# Patient Record
Sex: Female | Born: 1975 | Race: White | Hispanic: No | Marital: Married | Smoking: Never smoker
Health system: Southern US, Community
[De-identification: ages and names within clinical notes are randomized; demographics above are authoritative.]

## PROBLEM LIST (undated history)

## (undated) DIAGNOSIS — R519 Headache, unspecified: Secondary | ICD-10-CM

## (undated) DIAGNOSIS — F419 Anxiety disorder, unspecified: Secondary | ICD-10-CM

## (undated) DIAGNOSIS — R51 Headache: Secondary | ICD-10-CM

## (undated) DIAGNOSIS — J40 Bronchitis, not specified as acute or chronic: Secondary | ICD-10-CM

## (undated) DIAGNOSIS — M549 Dorsalgia, unspecified: Secondary | ICD-10-CM

## (undated) DIAGNOSIS — K449 Diaphragmatic hernia without obstruction or gangrene: Secondary | ICD-10-CM

## (undated) HISTORY — DX: Anxiety disorder, unspecified: F41.9

## (undated) HISTORY — DX: Dorsalgia, unspecified: M54.9

## (undated) HISTORY — DX: Headache: R51

## (undated) HISTORY — DX: Bronchitis, not specified as acute or chronic: J40

## (undated) HISTORY — DX: Diaphragmatic hernia without obstruction or gangrene: K44.9

## (undated) HISTORY — DX: Headache, unspecified: R51.9

## (undated) HISTORY — PX: WISDOM TOOTH EXTRACTION: SHX21

---

## 2006-08-02 ENCOUNTER — Ambulatory Visit (HOSPITAL_COMMUNITY): Admission: RE | Admit: 2006-08-02 | Discharge: 2006-08-02 | Payer: Self-pay | Admitting: Family Medicine

## 2007-04-22 ENCOUNTER — Emergency Department (HOSPITAL_COMMUNITY): Admission: EM | Admit: 2007-04-22 | Discharge: 2007-04-22 | Payer: Self-pay | Admitting: Emergency Medicine

## 2010-09-24 ENCOUNTER — Ambulatory Visit (HOSPITAL_COMMUNITY)
Admission: RE | Admit: 2010-09-24 | Discharge: 2010-09-24 | Disposition: A | Discharge status: Home / Self Care | Payer: BC Managed Care – PPO | Source: Ambulatory Visit | Attending: Internal Medicine | Admitting: Internal Medicine

## 2010-09-24 ENCOUNTER — Other Ambulatory Visit (HOSPITAL_COMMUNITY): Payer: Self-pay | Admitting: Internal Medicine

## 2010-09-24 DIAGNOSIS — R05 Cough: Secondary | ICD-10-CM

## 2010-09-24 DIAGNOSIS — J029 Acute pharyngitis, unspecified: Secondary | ICD-10-CM | POA: Insufficient documentation

## 2010-09-24 DIAGNOSIS — R059 Cough, unspecified: Secondary | ICD-10-CM | POA: Insufficient documentation

## 2010-10-26 LAB — RAPID STREP SCREEN (MED CTR MEBANE ONLY): Streptococcus, Group A Screen (Direct): NEGATIVE

## 2010-10-26 LAB — URINALYSIS, ROUTINE W REFLEX MICROSCOPIC
Nitrite: NEGATIVE
Specific Gravity, Urine: 1.022
Urobilinogen, UA: 0.2

## 2010-10-26 LAB — DIFFERENTIAL
Basophils Absolute: 0
Lymphocytes Relative: 18
Monocytes Absolute: 0.6
Neutro Abs: 5.2
Neutrophils Relative %: 71

## 2010-10-26 LAB — COMPREHENSIVE METABOLIC PANEL
Albumin: 3.8
BUN: 12
Creatinine, Ser: 0.78
Glucose, Bld: 100 — ABNORMAL HIGH
Total Bilirubin: 0.6
Total Protein: 6.9

## 2010-10-26 LAB — STREP A DNA PROBE

## 2010-10-26 LAB — CBC
HCT: 37.2
MCV: 83.1
Platelets: 333
RDW: 12.6

## 2010-10-26 LAB — PREGNANCY, URINE: Preg Test, Ur: NEGATIVE

## 2010-10-26 LAB — URINE MICROSCOPIC-ADD ON

## 2010-12-09 ENCOUNTER — Ambulatory Visit (INDEPENDENT_AMBULATORY_CARE_PROVIDER_SITE_OTHER): Payer: BC Managed Care – PPO | Admitting: General Surgery

## 2010-12-09 ENCOUNTER — Encounter (INDEPENDENT_AMBULATORY_CARE_PROVIDER_SITE_OTHER): Payer: Self-pay | Admitting: General Surgery

## 2010-12-09 ENCOUNTER — Other Ambulatory Visit (INDEPENDENT_AMBULATORY_CARE_PROVIDER_SITE_OTHER): Payer: Self-pay | Admitting: General Surgery

## 2010-12-09 VITALS — BP 122/80 | HR 64 | Temp 97.4°F | Resp 14 | Ht 64.0 in | Wt 218.0 lb

## 2010-12-09 DIAGNOSIS — K8010 Calculus of gallbladder with chronic cholecystitis without obstruction: Secondary | ICD-10-CM

## 2010-12-09 NOTE — Progress Notes (Signed)
Subjective:     Patient ID: Judy Olson, female   DOB: 04/06/1975, 35 y.o.   MRN: [id number]  HPI   Review of Systems Patient is a 35-year-old mother who is referred [facility] the [facility] for symptomatic gallstones she said her first episode of pain wasn't really when she was pregnant with her second child approximately 3 years ago over the last 3 years she's had intermittent episodes lately once every other month or so and the pain would usually last about an hour she's had some more severe episodes and then more frequent in September and that's what prompted the ultrasound that was performed at an [facility] and that showed multipal stones and a 6 mm bileduct. 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 little school as possible. She has made arrangements for her mother-in-law acute the 2 children ages 3 and 7 husband come with her with the surgery is done as an outpatient and she might spend a night after surgery.   No Known Allergies History reviewed. No pertinent past surgical history. Current Outpatient Prescriptions  Medication Sig Dispense Refill  . ibuprofen (ADVIL,MOTRIN) 800 MG tablet Ad lib.      . loratadine (CLARITIN) 10 MG tablet Take 10 mg by mouth daily.        . MICROGESTIN FE 1/20 1-20 MG-MCG tablet       . citalopram (CELEXA) 40 MG tablet 40 mg daily.       . desvenlafaxine (PRISTIQ) 100 MG 24 hr tablet Take 100 mg by mouth daily. Please check dose       . omeprazole (PRILOSEC) 10 MG capsule Take 10 mg by mouth as needed. 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 she had 2 vaginal deliveries her her only surgical procedure. Objective:   Physical ExamBP 122/80   Pulse 64  Temp(Src) 97.4 F (36.3 C) (Temporal)  Resp 14  Ht 5' 4" (1.626 m)  Wt 218 lb (98.884 kg)  BMI 37.42 kg/m2 Patient is a slightly overweight Caucasian female in no acute distress eyes ears nose and throat unremarkable lungs are clear cardiac normal sinus rhythm and did not actual breast exam she's got no tenderness in the upper right abdomen no evidence of an umbilical hernia in the groin hernias I did not do a pelvic rectal examination. Extremities unremarkable no pedal edema CNS and physiologic    Assessment:    Symptomatic gallstones I reviewed the little booklet about biliary surgery with her and I do not have a copy of the cement she's not allergic to penicillin and will arrange for her surgery the week before Thanksgiving recess so that hopefully she'll miss. 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     Plan:     See above      

## 2010-12-09 NOTE — Patient Instructions (Signed)
Low-fat small sized meals and do not eating better to 3 hours after a meal until your surgery. We will let him make a decision in the recover room on spenting one night or home the day of surgery

## 2010-12-09 NOTE — H&P (Signed)
Patient ID: Judy Olson, female   DOB: January 02, 1976, 35 y.o.   MRN: [id number]  HPI   Review of Systems Patient is a 35 year old mother who is referred [facility] the [facility] for symptomatic gallstones she said her first episode of pain wasn't really when she was pregnant with her second child approximately 3 years ago over the last 3 years she's had intermittent episodes lately once every other month or so and the pain would usually last about an hour she's had some more severe episodes and then more frequent in September and that's what prompted the ultrasound that was performed at an [facility] and that showed multipal stones and a 6 mm bileduct. 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 little school as possible. She has made arrangements for her mother-in-law acute the 2 children ages 69 and 7 husband come with her with the surgery is done as an outpatient and she might spend a night after surgery.   No Known Allergies History reviewed. No pertinent past surgical history. Current Outpatient Prescriptions  Medication Sig Dispense Refill  . ibuprofen (ADVIL,MOTRIN) 800 MG tablet Ad lib.      Marland Kitchen loratadine (CLARITIN) 10 MG tablet Take 10 mg by mouth daily.        Marland Kitchen MICROGESTIN FE 1/20 1-20 MG-MCG tablet       . citalopram (CELEXA) 40 MG tablet 40 mg daily.       Marland Kitchen desvenlafaxine (PRISTIQ) 100 MG 24 hr tablet Take 100 mg by mouth daily. Please check dose       . omeprazole (PRILOSEC) 10 MG capsule Take 10 mg by mouth as needed. 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 she had 2 vaginal deliveries her her only surgical procedure. Objective:   Physical ExamBP 122/80  Pulse 64   Temp(Src) 97.4 F (36.3 C) (Temporal)  Resp 14  Ht 5\' 4"  (1.626 m)  Wt 218 lb (98.884 kg)  BMI 37.42 kg/m2 Patient is a slightly overweight Caucasian female in no acute distress eyes ears nose and throat unremarkable lungs are clear cardiac normal sinus rhythm and did not actual breast exam she's got no tenderness in the upper right abdomen no evidence of an umbilical hernia in the groin hernias I did not do a pelvic rectal examination. Extremities unremarkable no pedal edema CNS and physiologic    Assessment:    Symptomatic gallstones I reviewed the little booklet about biliary surgery with her and I do not have a copy of the cement she's not allergic to penicillin and will arrange for her surgery the week before Thanksgiving recess so that hopefully she'll miss. 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

## 2010-12-10 ENCOUNTER — Encounter (HOSPITAL_COMMUNITY): Payer: Self-pay | Admitting: Pharmacy Technician

## 2010-12-16 ENCOUNTER — Encounter (HOSPITAL_COMMUNITY): Payer: BC Managed Care – PPO

## 2010-12-16 ENCOUNTER — Encounter (HOSPITAL_COMMUNITY): Payer: Self-pay

## 2010-12-16 LAB — COMPREHENSIVE METABOLIC PANEL
ALT: 8 U/L (ref 0–35)
Alkaline Phosphatase: 70 U/L (ref 39–117)
BUN: 16 mg/dL (ref 6–23)
CO2: 27 mEq/L (ref 19–32)
Chloride: 101 mEq/L (ref 96–112)
Glucose, Bld: 105 mg/dL — ABNORMAL HIGH (ref 70–99)
Potassium: 3.6 mEq/L (ref 3.5–5.1)
Sodium: 136 mEq/L (ref 135–145)
Total Bilirubin: 0.1 mg/dL — ABNORMAL LOW (ref 0.3–1.2)
Total Protein: 7.1 g/dL (ref 6.0–8.3)

## 2010-12-16 LAB — DIFFERENTIAL
Lymphocytes Relative: 22 % (ref 12–46)
Lymphs Abs: 1.8 10*3/uL (ref 0.7–4.0)
Monocytes Absolute: 0.5 10*3/uL (ref 0.1–1.0)
Monocytes Relative: 6 % (ref 3–12)
Neutro Abs: 5.6 10*3/uL (ref 1.7–7.7)

## 2010-12-16 LAB — HCG, SERUM, QUALITATIVE: Preg, Serum: NEGATIVE

## 2010-12-16 LAB — CBC
HCT: 37.3 % (ref 36.0–46.0)
Hemoglobin: 12.8 g/dL (ref 12.0–15.0)
MCHC: 34.3 g/dL (ref 30.0–36.0)
RBC: 4.42 MIL/uL (ref 3.87–5.11)

## 2010-12-16 NOTE — Pre-Procedure Instructions (Signed)
Chest x ray from 8/12 in epic

## 2010-12-16 NOTE — Patient Instructions (Signed)
20 Salle Brandle  12/16/2010   Your procedure is scheduled on:  12/23/2010   0930-1100  Report to [facility] at   0730 AM.  Call this number if you have problems the morning of surgery: [phone]   Remember:   Do not eat food:After Midnight.  Tuesday  NIGHT  Do not drink clear liquids: After Midnight.TUESDAY NIGHT  Take these medicines the morning of surgery with A SIP OF WATER: omeprazole, celexa and claritin with sip water   Do not wear jewelry, make-up or nail polish.  Do not wear lotions, powders, or perfumes. You may wear deodorant.  Do not shave 48 hours prior to surgery.  Do not bring valuables to the hospital.  Contacts, dentures or bridgework may not be worn into surgery.  Leave suitcase in the car. After surgery it may be brought to your room.  For patients admitted to the hospital, checkout time is 11:00 AM the day of discharge.   Patients discharged the day of surgery will not be allowed to drive home.  Name and phone number of your driver: husband  Special Instructions: CHG Shower Use Special Wash: 1/2 bottle night before surgery and 1/2 bottle morning of surgery.  No shaving after Saturday, regular soap face and privates   Please read over the following fact sheets that you were given: MRSA Information

## 2010-12-18 ENCOUNTER — Telehealth (INDEPENDENT_AMBULATORY_CARE_PROVIDER_SITE_OTHER): Payer: Self-pay | Admitting: General Surgery

## 2010-12-18 NOTE — Telephone Encounter (Signed)
Call returned.

## 2010-12-23 ENCOUNTER — Encounter (HOSPITAL_COMMUNITY): Payer: Self-pay | Admitting: *Deleted

## 2010-12-23 ENCOUNTER — Encounter (HOSPITAL_COMMUNITY)
Admission: RE | Disposition: A | Discharge status: Home / Self Care | Payer: Self-pay | Source: Ambulatory Visit | Attending: General Surgery

## 2010-12-23 ENCOUNTER — Ambulatory Visit (HOSPITAL_COMMUNITY): Payer: BC Managed Care – PPO

## 2010-12-23 ENCOUNTER — Ambulatory Visit (HOSPITAL_COMMUNITY): Payer: BC Managed Care – PPO | Admitting: *Deleted

## 2010-12-23 ENCOUNTER — Encounter (INDEPENDENT_AMBULATORY_CARE_PROVIDER_SITE_OTHER): Payer: Self-pay

## 2010-12-23 ENCOUNTER — Ambulatory Visit (HOSPITAL_COMMUNITY)
Admission: RE | Admit: 2010-12-23 | Discharge: 2010-12-24 | Disposition: A | Discharge status: Home / Self Care | Payer: BC Managed Care – PPO | Source: Ambulatory Visit | Attending: General Surgery | Admitting: General Surgery

## 2010-12-23 ENCOUNTER — Other Ambulatory Visit (INDEPENDENT_AMBULATORY_CARE_PROVIDER_SITE_OTHER): Payer: Self-pay | Admitting: General Surgery

## 2010-12-23 ENCOUNTER — Encounter (INDEPENDENT_AMBULATORY_CARE_PROVIDER_SITE_OTHER): Payer: Self-pay | Admitting: Nurse Practitioner

## 2010-12-23 DIAGNOSIS — K8010 Calculus of gallbladder with chronic cholecystitis without obstruction: Secondary | ICD-10-CM

## 2010-12-23 DIAGNOSIS — Z01812 Encounter for preprocedural laboratory examination: Secondary | ICD-10-CM | POA: Insufficient documentation

## 2010-12-23 DIAGNOSIS — R109 Unspecified abdominal pain: Secondary | ICD-10-CM | POA: Insufficient documentation

## 2010-12-23 DIAGNOSIS — Z79899 Other long term (current) drug therapy: Secondary | ICD-10-CM | POA: Insufficient documentation

## 2010-12-23 HISTORY — PX: CHOLECYSTECTOMY: SHX55

## 2010-12-23 SURGERY — LAPAROSCOPIC CHOLECYSTECTOMY WITH INTRAOPERATIVE CHOLANGIOGRAM
Anesthesia: General | Site: Abdomen | Wound class: Clean Contaminated

## 2010-12-23 MED ORDER — NEOSTIGMINE METHYLSULFATE 1 MG/ML IJ SOLN
INTRAMUSCULAR | Status: DC | PRN
  Administered 2010-12-23: 5 mg via INTRAVENOUS

## 2010-12-23 MED ORDER — ACETAMINOPHEN 10 MG/ML IV SOLN
INTRAVENOUS | Status: DC | PRN
  Administered 2010-12-23: 1000 mg via INTRAVENOUS

## 2010-12-23 MED ORDER — IOHEXOL 300 MG/ML  SOLN
INTRAMUSCULAR | Status: DC | PRN
  Administered 2010-12-23: 10:00:00 via INTRAMUSCULAR

## 2010-12-23 MED ORDER — LIDOCAINE HCL (CARDIAC) 20 MG/ML IV SOLN
INTRAVENOUS | Status: DC | PRN
  Administered 2010-12-23: 100 mg via INTRAVENOUS

## 2010-12-23 MED ORDER — NORETHIN ACE-ETH ESTRAD-FE 1-20 MG-MCG PO TABS: 1.0000 | ORAL_TABLET | Freq: Every day | ORAL | Status: DC

## 2010-12-23 MED ORDER — FENTANYL CITRATE 0.05 MG/ML IJ SOLN: 25.0000 ug | INTRAMUSCULAR | Status: DC | PRN

## 2010-12-23 MED ORDER — GLYCOPYRROLATE 0.2 MG/ML IJ SOLN
INTRAMUSCULAR | Status: DC | PRN
  Administered 2010-12-23: .8 mg via INTRAVENOUS

## 2010-12-23 MED ORDER — LACTATED RINGERS IV SOLN
INTRAVENOUS | Status: DC | PRN
  Administered 2010-12-23: 09:00:00 via INTRAVENOUS

## 2010-12-23 MED ORDER — BUPIVACAINE-EPINEPHRINE 0.25% -1:200000 IJ SOLN
INTRAMUSCULAR | Status: DC | PRN
  Administered 2010-12-23: 30 mL

## 2010-12-23 MED ORDER — PROPOFOL 10 MG/ML IV EMUL
INTRAVENOUS | Status: DC | PRN
  Administered 2010-12-23: 150 mg via INTRAVENOUS

## 2010-12-23 MED ORDER — ROCURONIUM BROMIDE 100 MG/10ML IV SOLN
INTRAVENOUS | Status: DC | PRN
  Administered 2010-12-23: 50 mg via INTRAVENOUS
  Administered 2010-12-23: 10 mg via INTRAVENOUS

## 2010-12-23 MED ORDER — DEXAMETHASONE SODIUM PHOSPHATE 10 MG/ML IJ SOLN
INTRAMUSCULAR | Status: DC | PRN
  Administered 2010-12-23: 10 mg via INTRAVENOUS

## 2010-12-23 MED ORDER — ONDANSETRON HCL 4 MG/2ML IJ SOLN
INTRAMUSCULAR | Status: DC | PRN
  Administered 2010-12-23: 4 mg via INTRAVENOUS

## 2010-12-23 MED ORDER — KCL IN DEXTROSE-NACL 20-5-0.45 MEQ/L-%-% IV SOLN
INTRAVENOUS | Status: DC
  Administered 2010-12-23: 18:00:00 via INTRAVENOUS
  Filled 2010-12-23 (×5): qty 1000

## 2010-12-23 MED ORDER — FENTANYL CITRATE 0.05 MG/ML IJ SOLN
INTRAMUSCULAR | Status: DC | PRN
  Administered 2010-12-23 (×2): 100 ug via INTRAVENOUS
  Administered 2010-12-23: 50 ug via INTRAVENOUS

## 2010-12-23 MED ORDER — HYDROCODONE-ACETAMINOPHEN 5-300 MG PO TABS: 1.0000 | ORAL_TABLET | ORAL | Status: DC | PRN

## 2010-12-23 MED ORDER — DESVENLAFAXINE SUCCINATE ER 100 MG PO TB24
100.0000 mg | ORAL_TABLET | ORAL | Status: DC
  Filled 2010-12-23 (×3): qty 1

## 2010-12-23 MED ORDER — HYDROMORPHONE HCL PF 1 MG/ML IJ SOLN
INTRAMUSCULAR | Status: DC | PRN
  Administered 2010-12-23 (×2): 1 mg via INTRAVENOUS

## 2010-12-23 MED ORDER — LACTATED RINGERS IR SOLN
Status: DC | PRN
  Administered 2010-12-23: 1000 mL

## 2010-12-23 MED ORDER — LORATADINE 10 MG PO TABS
10.0000 mg | ORAL_TABLET | Freq: Every day | ORAL | Status: DC
  Filled 2010-12-23 (×2): qty 1

## 2010-12-23 MED ORDER — NAPHAZOLINE HCL 0.1 % OP SOLN
1.0000 [drp] | Freq: Every day | OPHTHALMIC | Status: DC
  Filled 2010-12-23: qty 15

## 2010-12-23 MED ORDER — LACTATED RINGERS IV SOLN: INTRAVENOUS | Status: DC

## 2010-12-23 MED ORDER — CITALOPRAM HYDROBROMIDE 40 MG PO TABS
40.0000 mg | ORAL_TABLET | ORAL | Status: DC
  Administered 2010-12-24: 40 mg via ORAL
  Filled 2010-12-23 (×2): qty 1

## 2010-12-23 MED ORDER — HYDROCODONE-ACETAMINOPHEN 5-325 MG PO TABS: 1.0000 | ORAL_TABLET | ORAL | Status: DC | PRN

## 2010-12-23 MED ORDER — MIDAZOLAM HCL 5 MG/5ML IJ SOLN
INTRAMUSCULAR | Status: DC | PRN
  Administered 2010-12-23: 2 mg via INTRAVENOUS

## 2010-12-23 MED ORDER — SODIUM CHLORIDE 0.9 % IV SOLN
3.0000 g | INTRAVENOUS | Status: AC
  Administered 2010-12-23: 3 g via INTRAVENOUS

## 2010-12-23 MED ORDER — MORPHINE SULFATE 2 MG/ML IJ SOLN
2.0000 mg | INTRAMUSCULAR | Status: DC | PRN
  Administered 2010-12-23 (×3): 2 mg via INTRAVENOUS
  Filled 2010-12-23 (×2): qty 1

## 2010-12-23 MED ORDER — IBUPROFEN 800 MG PO TABS
800.0000 mg | ORAL_TABLET | Freq: Three times a day (TID) | ORAL | Status: DC | PRN
  Administered 2010-12-23: 800 mg via ORAL
  Filled 2010-12-23: qty 1

## 2010-12-23 MED ORDER — NAPHAZOLINE-GLYCERIN 0.012-0.2 % OP SOLN
1.0000 [drp] | OPHTHALMIC | Status: DC
  Filled 2010-12-23: qty 15

## 2010-12-23 MED ORDER — SODIUM CHLORIDE 0.9 % IR SOLN
Status: DC | PRN
  Administered 2010-12-23: 1000 mL

## 2010-12-23 MED ORDER — ONDANSETRON HCL 4 MG/5ML PO SOLN
4.0000 mg | Freq: Three times a day (TID) | ORAL | Status: DC | PRN
  Filled 2010-12-23: qty 5

## 2010-12-23 MED ORDER — PROMETHAZINE HCL 25 MG/ML IJ SOLN
6.2500 mg | INTRAMUSCULAR | Status: DC | PRN
  Administered 2010-12-23: 6.25 mg via INTRAVENOUS

## 2010-12-23 SURGICAL SUPPLY — 34 items
APPLIER CLIP ROT 10 11.4 M/L (STAPLE) ×2
APR CLP MED LRG 11.4X10 (STAPLE) ×1
BAG SPEC RTRVL LRG 6X4 10 (ENDOMECHANICALS) ×1
BENZOIN TINCTURE PRP APPL 2/3 (GAUZE/BANDAGES/DRESSINGS) ×2 IMPLANT
CANISTER SUCTION 2500CC (MISCELLANEOUS) ×2 IMPLANT
CLIP APPLIE ROT 10 11.4 M/L (STAPLE) ×1 IMPLANT
CLOTH BEACON ORANGE TIMEOUT ST (SAFETY) ×2 IMPLANT
COVER MAYO STAND STRL (DRAPES) ×2 IMPLANT
DECANTER SPIKE VIAL GLASS SM (MISCELLANEOUS) IMPLANT
DRAPE C-ARM 42X72 X-RAY (DRAPES) ×2 IMPLANT
DRAPE LAPAROSCOPIC ABDOMINAL (DRAPES) ×2 IMPLANT
ELECT REM PT RETURN 9FT ADLT (ELECTROSURGICAL) ×2
ELECTRODE REM PT RTRN 9FT ADLT (ELECTROSURGICAL) ×1 IMPLANT
GLOVE BIOGEL PI IND STRL 7.0 (GLOVE) ×1 IMPLANT
GLOVE BIOGEL PI INDICATOR 7.0 (GLOVE) ×1
GLOVE ORTHO TXT STRL SZ7.5 (GLOVE) ×2 IMPLANT
GOWN PREVENTION PLUS XLARGE (GOWN DISPOSABLE) ×2 IMPLANT
GOWN STRL NON-REIN LRG LVL3 (GOWN DISPOSABLE) ×2 IMPLANT
GOWN STRL REIN XL XLG (GOWN DISPOSABLE) ×2 IMPLANT
HEMOSTAT SURGICEL 4X8 (HEMOSTASIS) IMPLANT
KIT BASIN OR (CUSTOM PROCEDURE TRAY) ×2 IMPLANT
NS IRRIG 1000ML POUR BTL (IV SOLUTION) IMPLANT
POUCH SPECIMEN RETRIEVAL 10MM (ENDOMECHANICALS) ×2 IMPLANT
SET CHOLANGIOGRAPH MIX (MISCELLANEOUS) ×2 IMPLANT
SET IRRIG TUBING LAPAROSCOPIC (IRRIGATION / IRRIGATOR) ×2 IMPLANT
SOLUTION ANTI FOG 6CC (MISCELLANEOUS) ×2 IMPLANT
STRIP CLOSURE SKIN 1/2X4 (GAUZE/BANDAGES/DRESSINGS) ×2 IMPLANT
SUT VIC AB 0 UR5 27 (SUTURE) IMPLANT
SUT VIC AB 4-0 PS2 27 (SUTURE) ×2 IMPLANT
TRAY LAP CHOLE (CUSTOM PROCEDURE TRAY) ×2 IMPLANT
TROCAR BLADELESS OPT 5 75 (ENDOMECHANICALS) ×4 IMPLANT
TROCAR XCEL BLUNT TIP 100MML (ENDOMECHANICALS) ×2 IMPLANT
TROCAR XCEL NON-BLD 11X100MML (ENDOMECHANICALS) ×2 IMPLANT
TUBING INSUFFLATION 10FT LAP (TUBING) ×2 IMPLANT

## 2010-12-23 NOTE — Transfer of Care (Signed)
Immediate Anesthesia Transfer of Care Note  Patient: Judy Olson  Procedure(s) Performed:  LAPAROSCOPIC CHOLECYSTECTOMY WITH INTRAOPERATIVE CHOLANGIOGRAM - LAP CHOLE WITH X-RAY  Patient Location: PACU  Anesthesia Type: General  Level of Consciousness: awake, alert  and oriented  Airway & Oxygen Therapy: Patient Spontanous Breathing and Patient connected to face mask oxygen  Post-op Assessment: Report given to PACU RN and Post -op Vital signs reviewed and stable  Post vital signs: Reviewed and stable  Complications: No apparent anesthesia complications

## 2010-12-23 NOTE — Interval H&P Note (Signed)
History and Physical Interval Note:   12/23/2010   9:05 AM   Judy Olson  has presented today for surgery, with the diagnosis of GALLSTONES  The various methods of treatment have been discussed with the patient and family. After consideration of risks, benefits and other options for treatment, the patient has consented to  Procedure(s): LAPAROSCOPIC CHOLECYSTECTOMY WITH INTRAOPERATIVE CHOLANGIOGRAM as a surgical intervention .  The patients' history has been reviewed, patient examined, no change in status, stable for surgery.  I have reviewed the patients' chart and labs.  Questions were answered to the patient's satisfaction.     Judy Coach  MD Pt reexamined no change permit signed. Pt plans discharge after or. chronic cholecystitis with stones

## 2010-12-23 NOTE — Anesthesia Preprocedure Evaluation (Signed)
Anesthesia Evaluation  Patient identified by MRN, date of birth, ID band Patient awake    Reviewed: Allergy & Precautions, H&P , NPO status , Patient's Chart, lab work & pertinent test results  Airway Mallampati: II TM Distance: >3 FB Neck ROM: Full    Dental No notable dental hx. (+) Teeth Intact   Pulmonary neg pulmonary ROS,  clear to auscultation  Pulmonary exam normal       Cardiovascular neg cardio ROS Regular Normal    Neuro/Psych  Headaches, PSYCHIATRIC DISORDERS Anxiety    GI/Hepatic negative GI ROS, Neg liver ROS,   Endo/Other  Morbid obesity  Renal/GU negative Renal ROS  Genitourinary negative   Musculoskeletal negative musculoskeletal ROS (+)   Abdominal   Peds negative pediatric ROS (+)  Hematology negative hematology ROS (+)   Anesthesia Other Findings   Reproductive/Obstetrics negative OB ROS                           Anesthesia Physical Anesthesia Plan  ASA: II  Anesthesia Plan: General   Post-op Pain Management:    Induction: Intravenous  Airway Management Planned: Oral ETT  Additional Equipment:   Intra-op Plan:   Post-operative Plan: Extubation in OR  Informed Consent: I have reviewed the patients History and Physical, chart, labs and discussed the procedure including the risks, benefits and alternatives for the proposed anesthesia with the patient or authorized representative who has indicated his/her understanding and acceptance.   Dental advisory given  Plan Discussed with: CRNA  Anesthesia Plan Comments:         Anesthesia Quick Evaluation

## 2010-12-23 NOTE — Op Note (Signed)
NAMELEYAN, BRANDEN               ACCOUNT NO.:  [id number]  MEDICAL RECORD NO.:  [id number]  LOCATION:  [facility]                         FACILITY:  [facility]  PHYSICIAN:  Anselm Pancoast. Cortny Bambach, M.D.DATE OF BIRTH:  1975/10/27  DATE OF PROCEDURE:  12/23/2010 DATE OF DISCHARGE:                              OPERATIVE REPORT   PREOPERATIVE DIAGNOSIS:  Chronic cholecystitis with stones.  POSTOPERATIVE DIAGNOSIS:  Chronic cholecystitis with stones.  OPERATION:  Laparoscopic cholecystectomy with cholangiogram, general anesthesia.  SURGEON:  Anselm Pancoast. Zachery Dakins, M.D.  ASSISTANT:  Ollen Gross. Vernell Morgans, M.D.  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 Vernon Prey and his PA for symptomatic gallstones.  The patient's last pregnancy was approximately 3 years ago and she has had intermittent episodes of right upper quadrant abdominal pain since that time.  The patient recently had a significant episode of pain and an ultrasound of the gallbladder was obtained, which confirmed 2 good-sized stones within the gallbladder. 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  The patient is here for the planned procedure and states that she has not had further episode of pain since I saw her in the office.  The patient was taken to the operative suite, the time-out had been completed, induction of general anesthesia by Dr. Rica Mast, and then the abdomen was prepped with Betadine solution and draped in a sterile manner.  A small incision was made below the umbilicus after the time- out had been completed.  The fascia was identified and a small opening made, picked up with Kocher's and then carefully entered into the peritoneal cavity.  Pursestring suture was placed and Hasson cannula introduced.  Dr. Carolynne Edouard had  scrubbed in at this time.  The upper 10- mm trocar was placed in the subxiphoid area under direct vision after anesthetizing the fascia and the two lateral 5-mm trocars were placed by Dr. Carolynne Edouard at the appropriate lateral position.  The gallbladder was thickened, but not acutely inflamed.  The proximal portion of the gallbladder was visualized, the peritoneum was carefully opened, and then I was able to encompass the junction of the cystic duct. Gallbladder with a right-angle on a clip placed across this area.  It was a little small blood vessel going to the area that I thought could be lightly cauterized and there was a little bit of bleeding from this area; the cystic duct or cystic artery, which at first we were not sure whether this was a cystic artery or possibly the right hepatic branch. I am going to check the area and dissected the gallbladder to give a little more visualization and then we could see that this definitely was a fairly large size cystic artery and then one clip was placed on it. The little bleeding basically had stopped at this point, so we did nothing further at this time except go ahead and do the cholangiogram. Cook catheter was introduced, held in place with a clip, and then the x- ray showed  that she has about a 2-cm cystic duct remaining and the right and left hepatic branches of the bile duct were identified.  I then removed the catheter, triply clipped the cystic duct about a centimeter more proximal where we had made the little otomy and then 3 clips were placed.  With this better visualization, we could definitely see that the vessel that at first we were not sure whether it was a cystic artery or not was definitely the cystic artery and two clips were placed proximally.  The little area that was bleeding was under one of the clips; it is not bleeding now and then another clip placed upon the actual gallbladder surface.  Both the cystic duct and the cystic  artery distal to the clips were then divided and then the gallbladder was teased from the bed with the hook electrocautery.  There was good hemostasis.  I then cauterized the bed and the gallbladder was placed in an EndoCatch bag.  The camera was switched to the upper 10 mm and then the bag containing the gallbladder was grasped and brought out through the fascia.  I did enlarge the fascia just slightly so that the two generous sized stones could come out easily.  I then, in looking down in the pelvis, could see that there was a little area at first whether it was endometriosis or what, and we lightly cauterized this.  There must have been an adhesion that caused a little bit of blood that was in the pelvis.  This was lightly coagulated and then on picking up the left and right ovaries to see whether there was any evidence of endometriosis, she has got a pretty good size corpus luteal cyst of the left ovary that a picture was taken and I will give it to the patient.  It is large enough that when it does rupture, it will certainly give her an episode of pain and at the age of 69, I think it is very unlikely to be of any possibility of ovarian tumor, but it is something this probably will mode.  She has plans to go home after the surgery today and we will plan on seeing her back in the office in about 10 weeks and she will have pain medication.  She plans to go to school on Monday.     Anselm Pancoast. Zachery Dakins, M.D.     WJW/MEDQ  D:  12/23/2010  T:  12/23/2010  Job:  [id number]

## 2010-12-23 NOTE — Anesthesia Postprocedure Evaluation (Signed)
Anesthesia Post Note  Patient: Judy Olson  Procedure(s) Performed:  LAPAROSCOPIC CHOLECYSTECTOMY WITH INTRAOPERATIVE CHOLANGIOGRAM - LAP CHOLE WITH X-RAY  Anesthesia type: General  Patient location: PACU  Post pain: Pain level controlled  Post assessment: Post-op Vital signs reviewed  Last Vitals:  Filed Vitals:   12/23/10 1145  BP: 117/69  Pulse:   Temp: 36.5 C  Resp:     Post vital signs: Reviewed  Level of consciousness: sedated  Complications: No apparent anesthesia complications

## 2010-12-23 NOTE — H&P (View-Only) (Signed)
Subjective:     Patient ID: Judy Olson, female   DOB: 1975/06/29, 35 y.o.   MRN: [id number]  HPI   Review of Systems Patient is a 35 year old mother who is referred [facility] the [facility] for symptomatic gallstones she said her first episode of pain wasn't really when she was pregnant with her second child approximately 3 years ago over the last 3 years she's had intermittent episodes lately once every other month or so and the pain would usually last about an hour she's had some more severe episodes and then more frequent in September and that's what prompted the ultrasound that was performed at an [facility] and that showed multipal stones and a 6 mm bileduct. 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 little school as possible. She has made arrangements for her mother-in-law acute the 2 children ages 53 and 7 husband come with her with the surgery is done as an outpatient and she might spend a night after surgery.   No Known Allergies History reviewed. No pertinent past surgical history. Current Outpatient Prescriptions  Medication Sig Dispense Refill  . ibuprofen (ADVIL,MOTRIN) 800 MG tablet Ad lib.      Marland Kitchen loratadine (CLARITIN) 10 MG tablet Take 10 mg by mouth daily.        Marland Kitchen MICROGESTIN FE 1/20 1-20 MG-MCG tablet       . citalopram (CELEXA) 40 MG tablet 40 mg daily.       Marland Kitchen desvenlafaxine (PRISTIQ) 100 MG 24 hr tablet Take 100 mg by mouth daily. Please check dose       . omeprazole (PRILOSEC) 10 MG capsule Take 10 mg by mouth as needed. 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 she had 2 vaginal deliveries her her only surgical procedure. Objective:   Physical ExamBP 122/80   Pulse 64  Temp(Src) 97.4 F (36.3 C) (Temporal)  Resp 14  Ht 5\' 4"  (1.626 m)  Wt 218 lb (98.884 kg)  BMI 37.42 kg/m2 Patient is a slightly overweight Caucasian female in no acute distress eyes ears nose and throat unremarkable lungs are clear cardiac normal sinus rhythm and did not actual breast exam she's got no tenderness in the upper right abdomen no evidence of an umbilical hernia in the groin hernias I did not do a pelvic rectal examination. Extremities unremarkable no pedal edema CNS and physiologic    Assessment:    Symptomatic gallstones I reviewed the little booklet about biliary surgery with her and I do not have a copy of the cement she's not allergic to penicillin and will arrange for her surgery the week before Thanksgiving recess so that hopefully she'll miss. 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     Plan:     See above

## 2010-12-23 NOTE — Plan of Care (Signed)
Problem: Phase I Progression Outcomes Goal: Pain controlled with appropriate interventions Outcome: Progressing progressing Goal: OOB as tolerated unless otherwise ordered Outcome: Progressing yes Goal: Incision/dressings dry and intact Outcome: Progressing yes Goal: Sutures/staples intact Outcome: Progressing yes Goal: Tubes/drains patent Outcome: Not Applicable Date Met:  12/23/10 None Goal: Initial discharge plan identified Outcome: Adequate for Discharge Adequate Goal: Voiding-avoid urinary catheter unless indicated Outcome: Completed/Met Date Met:  12/23/10 voids Goal: Vital signs/hemodynamically stable Outcome: Progressing stable Goal: Other Phase I Outcomes/Goals Outcome: Not Progressing To be met  Problem: Phase II Progression Outcomes Goal: Pain controlled Outcome: Progressing With interventions

## 2010-12-23 NOTE — Brief Op Note (Signed)
12/23/2010  11:15 AM  PATIENT:  Judy Olson  35 y.o. female  PRE-OPERATIVE DIAGNOSIS:  GALLSTONES  POST-OPERATIVE DIAGNOSIS:  gallstones  PROCEDURE:  Procedure(s): LAPAROSCOPIC CHOLECYSTECTOMY WITH INTRAOPERATIVE CHOLANGIOGRAM  SURGEON:  Surgeon(s): [name], MD Robyne Askew, MD  ASSISTANTS: Chevis Pretty ANESTHESIA:   general  EBL:  Total I/O In: 200 [I.V.:200] Out: 40 [Blood:40]  BLOOD ADMINISTERED:none  DRAINS: none   LOCAL MEDICATIONS USED:  MARCAINE 25CC  SPECIMEN:  Source of Specimen:  gall bladder stone  DISPOSITION OF SPECIMEN:  PATHOLOGY  COUNTS:  YES  TOURNIQUET:  * No tourniquets in log *  DICTATION: .Other Dictation: Dictation Number dictated but I didnot get the number  PLAN OF CARE: Discharge to home after PACU  PATIENT DISPOSITION:  PACU - hemodynamically stable.   Delay start of Pharmacological VTE agent (>24hrs) due to surgical blood loss or risk of bleeding:  {YES/NO/NOT APPLICABLE:20182

## 2010-12-24 NOTE — Progress Notes (Signed)
Patient stable and tolerating diet. IV access d/ced and dressing applied. Patient given d/c instructions and prescriptions and verbalized understanding. Home medication reconciled. Patient d/ced to home with family.

## 2010-12-25 ENCOUNTER — Other Ambulatory Visit (INDEPENDENT_AMBULATORY_CARE_PROVIDER_SITE_OTHER): Payer: Self-pay | Admitting: Surgery

## 2010-12-28 ENCOUNTER — Encounter (HOSPITAL_COMMUNITY): Payer: Self-pay | Admitting: General Surgery

## 2011-01-01 ENCOUNTER — Telehealth (INDEPENDENT_AMBULATORY_CARE_PROVIDER_SITE_OTHER): Payer: Self-pay | Admitting: General Surgery

## 2011-01-01 NOTE — Telephone Encounter (Signed)
Patient informed of the Dr. Annette Stable afternoon schedule.

## 2011-01-04 ENCOUNTER — Encounter (INDEPENDENT_AMBULATORY_CARE_PROVIDER_SITE_OTHER): Payer: BC Managed Care – PPO | Admitting: General Surgery

## 2011-01-07 ENCOUNTER — Ambulatory Visit (INDEPENDENT_AMBULATORY_CARE_PROVIDER_SITE_OTHER): Payer: BC Managed Care – PPO | Admitting: General Surgery

## 2011-01-07 ENCOUNTER — Encounter (INDEPENDENT_AMBULATORY_CARE_PROVIDER_SITE_OTHER): Payer: Self-pay | Admitting: General Surgery

## 2011-01-07 VITALS — BP 118/84 | HR 60 | Temp 97.0°F | Resp 18 | Ht 64.0 in | Wt 210.0 lb

## 2011-01-07 DIAGNOSIS — K8010 Calculus of gallbladder with chronic cholecystitis without obstruction: Secondary | ICD-10-CM

## 2011-01-07 NOTE — Progress Notes (Signed)
Subjective:     Patient ID: Judy Olson, female   DOB: 1975/05/18, 35 y.o.   MRN: [id number] Judy Olson return she is now approximately 2 weeks following her left upper cholecystectomy cholangiogram that was done it was too long she did spend the night but it took pain medication and she went to work the following Monday she is accompanied by her 2 children and they're all excited unit crisp sessions are healing nicely and she is not having any abdominal tenderness. She had a cyst in the left ovary that I took a picture of an she'll report this to her gynecologist. She is on birth control pills but understand that the cyst with rupture it looks like a benign-appearing cyst and she's only 35 years old but will let them follow that. Her incisions are healing nicely no tenderness at the navel and she went to work last week C. also a p.r.n. basis HPI   Review of Systems Current Outpatient Prescriptions  Medication Sig Dispense Refill  . citalopram (CELEXA) 40 MG tablet 40 mg every morning.       . desvenlafaxine (PRISTIQ) 100 MG 24 hr tablet Take 100 mg by mouth every morning. Please check dose      . Hydrocodone-Acetaminophen 5-300 MG TABS Take 1 tablet by mouth every 4 (four) hours as needed.  20 each  0  . ibuprofen (ADVIL,MOTRIN) 800 MG tablet Take 800 mg by mouth every 8 (eight) hours as needed. Pain       . loratadine (CLARITIN) 10 MG tablet Take 10 mg by mouth daily.       Marland Kitchen MICROGESTIN FE 1/20 1-20 MG-MCG tablet Take 1 tablet by mouth daily.       . naphazoline-glycerin (CLEAR EYES) 0.012-0.2 % SOLN Place 1-2 drops into both eyes every morning.       Marland Kitchen omeprazole (PRILOSEC) 20 MG capsule Take 20 mg by mouth daily.            Objective:   Physical Exam BP 118/84  Pulse 60  Temp(Src) 97 F (36.1 C) (Temporal)  Resp 18  Ht 5\' 4"  (1.626 m)  Wt 210 lb (95.255 kg)  BMI 36.05 kg/m2  LMP 11/30/2010  Recent cholecystectomy incisions all healed nicely and the patient's complaint of pain  in any of the areas no change in her bowel function and she'll see [place] on a p.r.n. basis     Assessment:     See note above    Plan:     prn

## 2011-01-27 ENCOUNTER — Encounter (INDEPENDENT_AMBULATORY_CARE_PROVIDER_SITE_OTHER): Payer: Self-pay

## 2012-09-04 ENCOUNTER — Ambulatory Visit (INDEPENDENT_AMBULATORY_CARE_PROVIDER_SITE_OTHER): Payer: BC Managed Care – PPO | Admitting: Family Medicine

## 2012-09-04 ENCOUNTER — Encounter: Payer: Self-pay | Admitting: Family Medicine

## 2012-09-04 ENCOUNTER — Telehealth: Payer: Self-pay | Admitting: Family Medicine

## 2012-09-04 VITALS — BP 135/95 | HR 85 | Temp 97.6°F | Ht 64.0 in | Wt 215.6 lb

## 2012-09-04 DIAGNOSIS — J069 Acute upper respiratory infection, unspecified: Secondary | ICD-10-CM

## 2012-09-04 MED ORDER — AZITHROMYCIN 250 MG PO TABS: ORAL_TABLET | ORAL | Status: DC

## 2012-09-04 NOTE — Progress Notes (Signed)
  Subjective:    Patient ID: Judy Olson, female    DOB: May 02, 1975, 37 y.o.   MRN: [id number]  HPI This 37 y.o. female presents for evaluation of sinus congestion and cough.  She has  Been having some facial discomfort and congestion.   Review of Systems No chest pain, SOB, HA, dizziness, vision change, N/V, diarrhea, constipation, dysuria, urinary urgency or frequency, myalgias, arthralgias or rash.     Objective:   Physical Exam  Vital signs noted  Well developed well nourished female.  HEENT - Head atraumatic Normocephalic                Eyes - PERRLA, Conjuctiva - clear Sclera- Clear EOMI                Ears - EAC's Wnl TM's Wnl Gross Hearing WNL                Nose - Nares patent                 Throat - oropharanx wnl Respiratory - Lungs CTA bilateral Cardiac - RRR S1 and S2 without murmur GI - Abdomen soft Nontender and bowel sounds active x 4 Extremities - No edema. Neuro - Grossly intact.     Assessment & Plan:  Acute upper respiratory infections of unspecified site - Plan: azithromycin (ZITHROMAX) 250 MG tablet Push po fluids, rest, and follow up prn if sx's continue or persist.

## 2012-09-04 NOTE — Telephone Encounter (Signed)
appt given  

## 2012-09-04 NOTE — Patient Instructions (Signed)

## 2012-12-19 ENCOUNTER — Other Ambulatory Visit (HOSPITAL_COMMUNITY): Payer: Self-pay | Admitting: Family Medicine

## 2012-12-19 ENCOUNTER — Ambulatory Visit (HOSPITAL_COMMUNITY)
Admission: RE | Admit: 2012-12-19 | Discharge: 2012-12-19 | Disposition: A | Discharge status: Home / Self Care | Payer: BC Managed Care – PPO | Source: Ambulatory Visit | Attending: Family Medicine | Admitting: Family Medicine

## 2012-12-19 DIAGNOSIS — R5383 Other fatigue: Secondary | ICD-10-CM

## 2012-12-19 DIAGNOSIS — R0789 Other chest pain: Secondary | ICD-10-CM | POA: Insufficient documentation

## 2012-12-19 DIAGNOSIS — R5381 Other malaise: Secondary | ICD-10-CM

## 2012-12-19 DIAGNOSIS — R0602 Shortness of breath: Secondary | ICD-10-CM | POA: Insufficient documentation

## 2013-01-05 ENCOUNTER — Institutional Professional Consult (permissible substitution): Payer: BC Managed Care – PPO | Admitting: Neurology

## 2013-01-23 ENCOUNTER — Ambulatory Visit: Payer: BC Managed Care – PPO | Admitting: Neurology

## 2013-07-11 ENCOUNTER — Ambulatory Visit (INDEPENDENT_AMBULATORY_CARE_PROVIDER_SITE_OTHER): Payer: BC Managed Care – PPO | Admitting: Family Medicine

## 2013-07-11 VITALS — BP 134/90 | HR 70 | Temp 98.5°F | Ht 64.0 in | Wt 208.0 lb

## 2013-07-11 DIAGNOSIS — J209 Acute bronchitis, unspecified: Secondary | ICD-10-CM

## 2013-07-11 MED ORDER — METHYLPREDNISOLONE ACETATE 80 MG/ML IJ SUSP
80.0000 mg | Freq: Once | INTRAMUSCULAR | Status: AC
  Administered 2013-07-11: 80 mg via INTRAMUSCULAR

## 2013-07-11 MED ORDER — BENZONATATE 100 MG PO CAPS: 100.0000 mg | ORAL_CAPSULE | Freq: Three times a day (TID) | ORAL | Status: DC | PRN

## 2013-07-11 MED ORDER — AZITHROMYCIN 250 MG PO TABS: ORAL_TABLET | ORAL | Status: DC

## 2013-07-11 NOTE — Progress Notes (Signed)
   Subjective:    Patient ID: Judy Olson, female    DOB: 1975/04/11, 38 y.o.   MRN: [id number]  HPI  This 38 y.o. female presents for evaluation of uri sx's with cough for 3 days.  She is having difficulty sleeping at night because she is coughing so much.  Review of Systems C/o cough and uri sx's   No chest pain, SOB, HA, dizziness, vision change, N/V, diarrhea, constipation, dysuria, urinary urgency or frequency, myalgias, arthralgias or rash.  Objective:   Physical Exam  Vital signs noted  Well developed well nourished female.  HEENT - Head atraumatic Normocephalic                Eyes - PERRLA, Conjuctiva - clear Sclera- Clear EOMI                Ears - EAC's Wnl TM's Wnl Gross Hearing WNL                Nose - Nares patent                 Throat - oropharanx wnl Respiratory - Lungs CTA bilateral Cardiac - RRR S1 and S2 without murmur GI - Abdomen soft Nontender and bowel sounds active x 4 Extremities - No edema. Neuro - Grossly intact.      Assessment & Plan:  Acute bronchitis - Plan: azithromycin (ZITHROMAX) 250 MG tablet, benzonatate (TESSALON PERLES) 100 MG capsule, methylPREDNISolone acetate (DEPO-MEDROL) injection 80 mg  Push po fluids, rest, tylenol and motrin otc prn as directed for fever, arthralgias, and myalgias.  Follow up prn if sx's continue or persist.  Lysbeth Penner FNP

## 2013-07-23 ENCOUNTER — Ambulatory Visit (INDEPENDENT_AMBULATORY_CARE_PROVIDER_SITE_OTHER): Payer: BC Managed Care – PPO | Admitting: Family Medicine

## 2013-07-23 ENCOUNTER — Encounter: Payer: Self-pay | Admitting: Family Medicine

## 2013-07-23 VITALS — BP 141/91 | HR 83 | Temp 98.4°F | Ht 64.0 in | Wt 205.8 lb

## 2013-07-23 DIAGNOSIS — J209 Acute bronchitis, unspecified: Secondary | ICD-10-CM

## 2013-07-23 MED ORDER — AMOXICILLIN 875 MG PO TABS: 875.0000 mg | ORAL_TABLET | Freq: Two times a day (BID) | ORAL | Status: DC

## 2013-07-23 MED ORDER — HYDROCODONE-HOMATROPINE 5-1.5 MG/5ML PO SYRP: 5.0000 mL | ORAL_SOLUTION | Freq: Three times a day (TID) | ORAL | Status: DC | PRN

## 2013-07-23 MED ORDER — LEVALBUTEROL HCL 1.25 MG/0.5ML IN NEBU: 1.2500 mg | INHALATION_SOLUTION | Freq: Once | RESPIRATORY_TRACT | Status: DC

## 2013-07-23 MED ORDER — PREDNISONE 10 MG PO TABS: ORAL_TABLET | ORAL | Status: DC

## 2013-07-23 MED ORDER — FLUCONAZOLE 150 MG PO TABS: 150.0000 mg | ORAL_TABLET | Freq: Once | ORAL | Status: DC

## 2013-07-23 NOTE — Progress Notes (Signed)
   Subjective:    Patient ID: [name], female    DOB: [birth year], 38 y.o.   MRN: [id number]  HPI This 38 y.o. female presents for evaluation of cough and bronchitis sx's.  She was seen over a week ago and she was tx'd with tessalon perles and zpak and this has not helped.  She is having persistent cough that is keeping her up at HS.   Review of Systems C/o cough and uri sx's No chest pain, SOB, HA, dizziness, vision change, N/V, diarrhea, constipation, dysuria, urinary urgency or frequency, myalgias, arthralgias or rash.     Objective:   Physical Exam  Vital signs noted  Well developed well nourished female.  HEENT - Head atraumatic Normocephalic                Eyes - PERRLA, Conjuctiva - clear Sclera- Clear EOMI                Ears - EAC's Wnl TM's Wnl Gross Hearing WNL                 Throat - oropharanx wnl Respiratory - Lungs diminshed bilateral Cardiac - RRR S1 and S2 without murmur GI - Abdomen soft Nontender and bowel sounds active x 4 Extremities - No edema. Neuro - Grossly intact.      Assessment & Plan:  Acute bronchitis, unspecified organism - Plan: amoxicillin (AMOXIL) 875 MG tablet, predniSONE (DELTASONE) 10 MG tablet, HYDROcodone-homatropine (HYCODAN) 5-1.5 MG/5ML syrup, levalbuterol (XOPENEX) nebulizer solution 1.25 mg  Push po fluids, rest, tylenol and motrin otc prn as directed for fever, arthralgias, and myalgias.  Follow up prn if sx's continue or persist.  Lysbeth Penner FNP

## 2013-07-23 NOTE — Addendum Note (Signed)
Addended by: Lysbeth Penner on: 07/23/2013 05:44 PM   Modules accepted: Orders

## 2013-11-05 ENCOUNTER — Other Ambulatory Visit (HOSPITAL_COMMUNITY): Payer: Self-pay | Admitting: Family Medicine

## 2013-11-05 ENCOUNTER — Ambulatory Visit (HOSPITAL_COMMUNITY)
Admission: RE | Admit: 2013-11-05 | Discharge: 2013-11-05 | Disposition: A | Discharge status: Home / Self Care | Payer: BC Managed Care – PPO | Source: Ambulatory Visit | Attending: Family Medicine | Admitting: Family Medicine

## 2013-11-05 DIAGNOSIS — M25512 Pain in left shoulder: Secondary | ICD-10-CM | POA: Diagnosis present

## 2013-11-06 ENCOUNTER — Other Ambulatory Visit (HOSPITAL_COMMUNITY): Payer: Self-pay | Admitting: Family Medicine

## 2013-12-10 ENCOUNTER — Encounter (INDEPENDENT_AMBULATORY_CARE_PROVIDER_SITE_OTHER): Payer: Self-pay

## 2013-12-10 ENCOUNTER — Encounter: Payer: Self-pay | Admitting: Family Medicine

## 2013-12-10 ENCOUNTER — Ambulatory Visit (INDEPENDENT_AMBULATORY_CARE_PROVIDER_SITE_OTHER): Payer: BC Managed Care – PPO | Admitting: Family Medicine

## 2013-12-10 VITALS — BP 128/81 | HR 83 | Temp 98.0°F | Ht 64.0 in | Wt 202.8 lb

## 2013-12-10 DIAGNOSIS — R002 Palpitations: Secondary | ICD-10-CM

## 2013-12-10 DIAGNOSIS — R9431 Abnormal electrocardiogram [ECG] [EKG]: Secondary | ICD-10-CM

## 2013-12-10 LAB — POCT CBC
Granulocyte percent: 59.6 %G (ref 37–80)
HCT, POC: 36.5 % — AB (ref 37.7–47.9)
Hemoglobin: 12 g/dL — AB (ref 12.2–16.2)
Lymph, poc: 2.2 (ref 0.6–3.4)
MCH, POC: 27.6 pg (ref 27–31.2)
MCHC: 32.9 g/dL (ref 31.8–35.4)
MCV: 83.8 fL (ref 80–97)
MPV: 9.2 fL (ref 0–99.8)
POC Granulocyte: 3.9 (ref 2–6.9)
POC LYMPH PERCENT: 33.4 %L (ref 10–50)
Platelet Count, POC: 212 10*3/uL (ref 142–424)
RBC: 4.4 M/uL (ref 4.04–5.48)
RDW, POC: 12.3 %
WBC: 6.5 10*3/uL (ref 4.6–10.2)

## 2013-12-10 NOTE — Addendum Note (Signed)
Addended by: Priscille Heidelberg on: 12/10/2013 04:26 PM   Modules accepted: Orders
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
## 2013-12-11 LAB — CMP14+EGFR
ALT: 6 IU/L (ref 0–32)
AST: 14 IU/L (ref 0–40)
Albumin/Globulin Ratio: 1.5 (ref 1.1–2.5)
Albumin: 4 g/dL (ref 3.5–5.5)
Alkaline Phosphatase: 70 IU/L (ref 39–117)
BUN/Creatinine Ratio: 20 (ref 8–20)
BUN: 14 mg/dL (ref 6–20)
CO2: 24 mmol/L (ref 18–29)
Calcium: 9 mg/dL (ref 8.7–10.2)
Chloride: 101 mmol/L (ref 97–108)
Creatinine, Ser: 0.71 mg/dL (ref 0.57–1.00)
GFR calc Af Amer: 124 mL/min/{1.73_m2} (ref >59)
GFR calc non Af Amer: 108 mL/min/{1.73_m2} (ref >59)
Globulin, Total: 2.6 g/dL (ref 1.5–4.5)
Glucose: 94 mg/dL (ref 65–99)
Potassium: 4 mmol/L (ref 3.5–5.2)
Sodium: 140 mmol/L (ref 134–144)
Total Bilirubin: <0.2 mg/dL (ref 0.0–1.2)
Total Protein: 6.6 g/dL (ref 6.0–8.5)

## 2013-12-11 LAB — THYROID PANEL WITH TSH
Free Thyroxine Index: 2.2 (ref 1.2–4.9)
T3 Uptake Ratio: 24 % (ref 24–39)
T4, Total: 9.3 ug/dL (ref 4.5–12.0)
TSH: 1.54 u[IU]/mL (ref 0.450–4.500)

## 2013-12-12 ENCOUNTER — Telehealth: Payer: Self-pay | Admitting: *Deleted

## 2013-12-12 ENCOUNTER — Ambulatory Visit: Payer: BC Managed Care – PPO | Admitting: Family Medicine

## 2013-12-12 NOTE — Telephone Encounter (Signed)
-----   Message from Lysbeth Penner, FNP sent at 12/12/2013 10:00 AM EST ----- Yes keep appointment with cardiology

## 2013-12-12 NOTE — Telephone Encounter (Signed)
Aware. 

## 2013-12-13 ENCOUNTER — Telehealth: Payer: Self-pay | Admitting: *Deleted

## 2013-12-13 NOTE — Telephone Encounter (Signed)
Pt notified of holter results Verbalizes understanding

## 2014-01-14 ENCOUNTER — Ambulatory Visit: Payer: BC Managed Care – PPO | Admitting: Family Medicine

## 2014-01-18 ENCOUNTER — Encounter: Payer: Self-pay | Admitting: Internal Medicine

## 2014-01-18 ENCOUNTER — Ambulatory Visit (INDEPENDENT_AMBULATORY_CARE_PROVIDER_SITE_OTHER): Payer: BC Managed Care – PPO | Admitting: Internal Medicine

## 2014-01-18 VITALS — BP 142/80 | HR 77 | Ht 63.0 in | Wt 203.1 lb

## 2014-01-18 DIAGNOSIS — I493 Ventricular premature depolarization: Secondary | ICD-10-CM

## 2014-01-18 NOTE — Patient Instructions (Signed)
Your physician has requested that you have an exercise tolerance test. For further information please visit [email or web address]. Please also follow instruction sheet, as given.  We will call you with the results.   You can follow up with Dr. Debara Pickett as needed.
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
## 2014-02-08 ENCOUNTER — Encounter (HOSPITAL_COMMUNITY): Payer: Self-pay | Admitting: *Deleted

## 2014-02-15 ENCOUNTER — Telehealth (HOSPITAL_COMMUNITY): Payer: Self-pay

## 2014-02-15 NOTE — Telephone Encounter (Signed)
Encounter complete. 

## 2014-02-19 ENCOUNTER — Telehealth (HOSPITAL_COMMUNITY): Payer: Self-pay

## 2014-02-19 NOTE — Telephone Encounter (Signed)
Encounter complete. 

## 2014-02-20 ENCOUNTER — Encounter (HOSPITAL_COMMUNITY): Payer: BC Managed Care – PPO

## 2014-03-05 ENCOUNTER — Telehealth (HOSPITAL_COMMUNITY): Payer: Self-pay

## 2014-03-05 NOTE — Telephone Encounter (Signed)
Encounter complete. 

## 2014-03-06 ENCOUNTER — Telehealth (HOSPITAL_COMMUNITY): Payer: Self-pay

## 2014-03-06 NOTE — Telephone Encounter (Signed)
Encounter complete. 

## 2014-03-07 ENCOUNTER — Ambulatory Visit (HOSPITAL_COMMUNITY)
Admission: RE | Admit: 2014-03-07 | Discharge: 2014-03-07 | Disposition: A | Discharge status: Home / Self Care | Payer: BC Managed Care – PPO | Source: Ambulatory Visit | Attending: Internal Medicine | Admitting: Internal Medicine

## 2014-03-07 DIAGNOSIS — R0609 Other forms of dyspnea: Secondary | ICD-10-CM | POA: Insufficient documentation

## 2014-03-07 DIAGNOSIS — I493 Ventricular premature depolarization: Secondary | ICD-10-CM | POA: Insufficient documentation

## 2014-03-07 NOTE — Procedures (Signed)
Exercise Treadmill Test   Test  Exercise Tolerance Test Ordering MD: Pixie Casino, MD    Unique Test No: 1  Treadmill:  1  Indication for ETT: Palpitations, Evaluate for Ischemia  Contraindication to ETT: No   Stress Modality: exercise - treadmill  Cardiac Imaging Performed: non   Protocol: standard Bruce - maximal  Max BP:  157/79  Max MPHR (bpm):  181 85% MPR (bpm):  154  MPHR obtained (bpm):  176 % MPHR obtained:  97  Reached 85% MPHR (min:sec):  6:50 Total Exercise Time (min-sec):  9  Workload in METS:  10.1 Borg Scale: 16  Reason ETT Terminated:  SOB and Fatigue    ST Segment Analysis At Rest: NSR, RAD, PRWP With Exercise: no evidence of significant ST depression  Other Information Arrhythmia:  Rare PVC and couplet Angina during ETT:  absent (0) Quality of ETT:  diagnostic  ETT Interpretation:  normal - no evidence of ischemia by ST analysis  Comments: ETT with good exercise tolerance (9:00); no chest pain; normal BP response; no ST changes; negative adequate ETT. Judy Olson

## 2014-03-08 ENCOUNTER — Encounter (HOSPITAL_COMMUNITY): Payer: BC Managed Care – PPO

## 2014-08-03 ENCOUNTER — Ambulatory Visit (INDEPENDENT_AMBULATORY_CARE_PROVIDER_SITE_OTHER): Payer: BC Managed Care – PPO | Admitting: Nurse Practitioner

## 2014-08-03 VITALS — BP 137/93 | HR 80 | Temp 97.1°F | Ht 63.0 in | Wt 209.0 lb

## 2014-08-03 DIAGNOSIS — R519 Headache, unspecified: Secondary | ICD-10-CM

## 2014-08-03 DIAGNOSIS — R51 Headache: Secondary | ICD-10-CM | POA: Diagnosis not present

## 2014-08-03 MED ORDER — KETOROLAC TROMETHAMINE 60 MG/2ML IM SOLN
60.0000 mg | Freq: Once | INTRAMUSCULAR | Status: AC
  Administered 2014-08-03: 60 mg via INTRAMUSCULAR

## 2014-08-03 NOTE — Progress Notes (Signed)
   Subjective:    Patient ID: Judy Olson, female    DOB: 1975-05-15, 39 y.o.   MRN: [id number]  HPI Patient in today c/o headache- mainly behind left eye- has been taking excedrin migraine and ibuprofen which has helped very little- Started almost 2 weeks ago-pain radiates around top of head- keeping her eyes closed helps. Denies nausea or vomiting- no visual changes. Rates pain 6-7/10.   * says that she keeps a dull headache all the time  Review of Systems  Constitutional: Negative.  Negative for fever, chills, appetite change and fatigue.  HENT: Negative for congestion, ear discharge and sinus pressure.   Eyes: Positive for photophobia, pain and visual disturbance. Negative for discharge and redness.  Respiratory: Negative.   Cardiovascular: Negative.   Genitourinary: Negative.   Neurological: Positive for headaches. Negative for dizziness, seizures, speech difficulty, weakness, light-headedness and numbness.  Psychiatric/Behavioral: Negative.   All other systems reviewed and are negative.      Objective:   Physical Exam  Constitutional: She is oriented to person, place, and time. She appears well-developed and well-nourished.  Eyes: Conjunctivae and EOM are normal. Pupils are equal, round, and reactive to light. Right eye exhibits no discharge. Left eye exhibits no discharge.  Left upper lid edema- no erythema  Cardiovascular: Normal rate, regular rhythm and normal heart sounds.   Pulmonary/Chest: Effort normal and breath sounds normal.  Neurological: She is alert and oriented to person, place, and time. No cranial nerve deficit.  Skin: Skin is warm.  Psychiatric: She has a normal mood and affect. Her behavior is normal. Judgment and thought content normal.   BP 137/93 mmHg  Pulse 80  Temp(Src) 97.1 F (36.2 C) (Oral)  Ht 5\' 3"  (1.6 m)  Wt 209 lb (94.802 kg)  BMI 37.03 kg/m2  LMP 08/01/2014 (Exact Date)        Assessment & Plan:   1. Acute intractable headache,  unspecified headache type    Meds ordered this encounter  Medications  . ketorolac (TORADOL) injection 60 mg    Sig:    Orders Placed This Encounter  Procedures  . CT Head W Contrast    Standing Status: Future     Number of Occurrences:      Standing Expiration Date: 11/03/2015    Order Specific Question:  Reason for Exam (SYMPTOM  OR DIAGNOSIS REQUIRED)    Answer:  intractale headache    Order Specific Question:  Is the patient pregnant?    Answer:  No    Order Specific Question:  Preferred imaging location?    Answer:  [facility]   Keep diary of headaches If worsens go to ER RTO prn  Mary-Margaret Hassell Done, [place]

## 2014-08-03 NOTE — Patient Instructions (Signed)

## 2014-08-07 ENCOUNTER — Telehealth: Payer: Self-pay | Admitting: Nurse Practitioner

## 2014-08-08 ENCOUNTER — Other Ambulatory Visit: Payer: Self-pay | Admitting: Nurse Practitioner

## 2014-08-08 ENCOUNTER — Telehealth: Payer: Self-pay | Admitting: Nurse Practitioner

## 2014-08-08 MED ORDER — AZITHROMYCIN 250 MG PO TABS: ORAL_TABLET | ORAL | Status: DC

## 2014-08-08 NOTE — Telephone Encounter (Signed)
Continued dull headache, L eye drainage Does she need to be treated with antibiotics first CT scan is expensive Please advise

## 2014-08-08 NOTE — Telephone Encounter (Signed)
Will wait and see what CT shows

## 2014-08-08 NOTE — Telephone Encounter (Signed)
Pt notified of antibiotic and MMM's recommendation Verbalizes understanding

## 2014-08-09 ENCOUNTER — Ambulatory Visit (HOSPITAL_COMMUNITY): Payer: BC Managed Care – PPO

## 2014-10-12 ENCOUNTER — Ambulatory Visit (INDEPENDENT_AMBULATORY_CARE_PROVIDER_SITE_OTHER): Payer: BC Managed Care – PPO | Admitting: Family Medicine

## 2014-10-12 ENCOUNTER — Encounter: Payer: Self-pay | Admitting: Family Medicine

## 2014-10-12 VITALS — BP 117/82 | HR 75 | Temp 98.5°F | Ht 63.0 in | Wt 205.0 lb

## 2014-10-12 DIAGNOSIS — J029 Acute pharyngitis, unspecified: Secondary | ICD-10-CM | POA: Diagnosis not present

## 2014-10-12 LAB — POCT RAPID STREP A (OFFICE): Rapid Strep A Screen: NEGATIVE

## 2014-10-12 MED ORDER — AMOXICILLIN 500 MG PO CAPS: 500.0000 mg | ORAL_CAPSULE | Freq: Three times a day (TID) | ORAL | Status: DC

## 2014-10-12 NOTE — Progress Notes (Signed)
Subjective:    Patient ID: Judy Olson, female    DOB: 1975/06/07, 39 y.o.   MRN: [id number]  HPI Patient here today for sore throat that started about 2 weeks ago.         Patient Active Problem List   Diagnosis Date Noted  . PVC's (premature ventricular contractions) 01/18/2014   Outpatient Encounter Prescriptions as of 10/12/2014  Medication Sig  . aspirin EC 81 MG tablet Take by mouth.  Marland Kitchen BIOTIN PO Take 1,000 mg by mouth.  . cetirizine (ZYRTEC) 10 MG tablet Take 10 mg by mouth daily.  . cholecalciferol (VITAMIN D) 1000 UNITS tablet Take 1,000 Units by mouth daily.  . finasteride (PROSCAR) 5 MG tablet Take 1/2pill daily  . GIANVI 3-0.02 MG tablet   . tretinoin (RETIN-A) 0.025 % cream Apply to the face in the evening  . clindamycin (CLEOCIN T) 1 % external solution Apply to affected areas 1 times a day  . [DISCONTINUED] azithromycin (ZITHROMAX Z-PAK) 250 MG tablet As directed   Facility-Administered Encounter Medications as of 10/12/2014  Medication  . levalbuterol (XOPENEX) nebulizer solution 1.25 mg     Review of Systems  Constitutional: Negative.   HENT: Positive for sore throat (left > right).   Eyes: Negative.   Respiratory: Negative.   Cardiovascular: Negative.   Gastrointestinal: Negative.   Endocrine: Negative.   Genitourinary: Negative.   Musculoskeletal: Negative.   Skin: Negative.   Allergic/Immunologic: Negative.   Neurological: Negative.   Hematological: Negative.   Psychiatric/Behavioral: Negative.        Objective:   Physical Exam  Constitutional: She is oriented to person, place, and time. She appears well-developed and well-nourished. No distress.  HENT:  Head: Normocephalic and atraumatic.  Right Ear: External ear normal.  Left Ear: External ear normal.  Nasal congestion and redness bilaterally. Throat swollen left greater than right  Eyes: Conjunctivae and EOM are normal. Pupils are equal, round, and reactive to light. Right eye  exhibits no discharge. Left eye exhibits no discharge. No scleral icterus.  Neck: Normal range of motion. Neck supple. No JVD present. No thyromegaly present.  Cardiovascular: Normal rate, regular rhythm and normal heart sounds.   No murmur heard. Pulmonary/Chest: Effort normal and breath sounds normal. No respiratory distress. She has no wheezes. She has no rales. She exhibits no tenderness.  Clear anteriorly and posteriorly  Musculoskeletal: Normal range of motion.  Lymphadenopathy:    She has cervical adenopathy (small anterior cervical adenopathy).  Neurological: She is alert and oriented to person, place, and time.  Skin: Skin is warm and dry. No rash noted.  Psychiatric: She has a normal mood and affect. Her behavior is normal. Judgment and thought content normal.  Nursing note and vitals reviewed.  BP 131/91 mmHg  Pulse 75  Temp(Src) 98.5 F (36.9 C) (Oral)  Ht 5\' 3"  (1.6 m)  Wt 205 lb (92.987 kg)  BMI 36.32 kg/m2  LMP 09/21/2014  Results for orders placed or performed in visit on 10/12/14  POCT rapid strep A  Result Value Ref Range   Rapid Strep A Screen Negative Negative         Assessment & Plan:  1. Sore throat -Gargle with warm salty water - POCT rapid strep A - Culture, Group A Strep - amoxicillin (AMOXIL) 500 MG capsule; Take 1 capsule (500 mg total) by mouth 3 (three) times daily.  Dispense: 30 capsule; Refill: 0  2. Acute pharyngitis, unspecified pharyngitis type -Frequent use nasal saline each nostril -  amoxicillin (AMOXIL) 500 MG capsule; Take 1 capsule (500 mg total) by mouth 3 (three) times daily.  Dispense: 30 capsule; Refill: 0  Patient Instructions  Take antibiotic as directed and until completed We will call you with the results of the throat culture when it becomes available Gargle with warm salty water and use nasal saline for nasal congestion Take Tylenol for aches pains and fever   Arrie Senate MD

## 2014-10-12 NOTE — Patient Instructions (Signed)
Take antibiotic as directed and until completed We will call you with the results of the throat culture when it becomes available Gargle with warm salty water and use nasal saline for nasal congestion Take Tylenol for aches pains and fever

## 2014-10-14 LAB — CULTURE, GROUP A STREP: Strep A Culture: NEGATIVE

## 2014-11-14 IMAGING — CR DG SHOULDER 2+V*L*
3 series · 3 of 3 positions shown · non-contrast
Comparison: None.

CLINICAL DATA: Nonionic upper left shoulder. Sore over the left
shoulder without injury.

EXAM:
LEFT SHOULDER - 2+ VIEW

[view not recorded (1 of 3)]
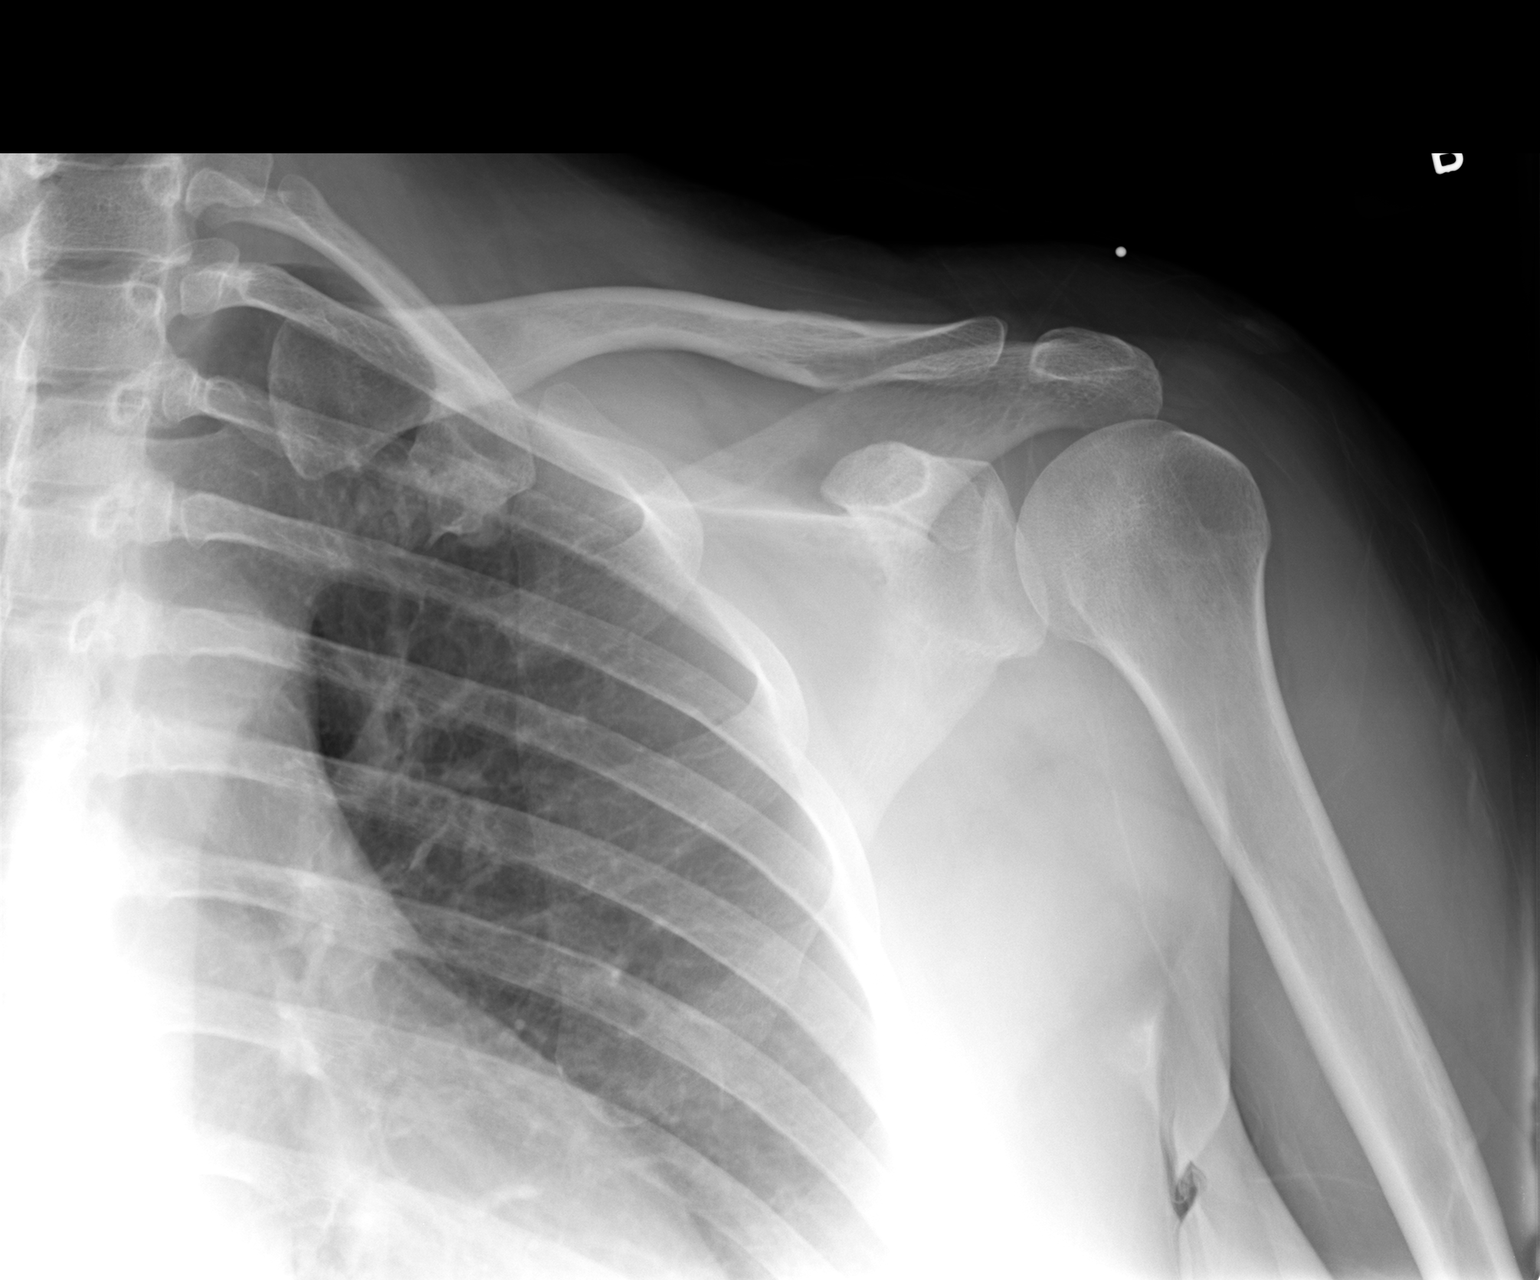

[view not recorded (2 of 3)]
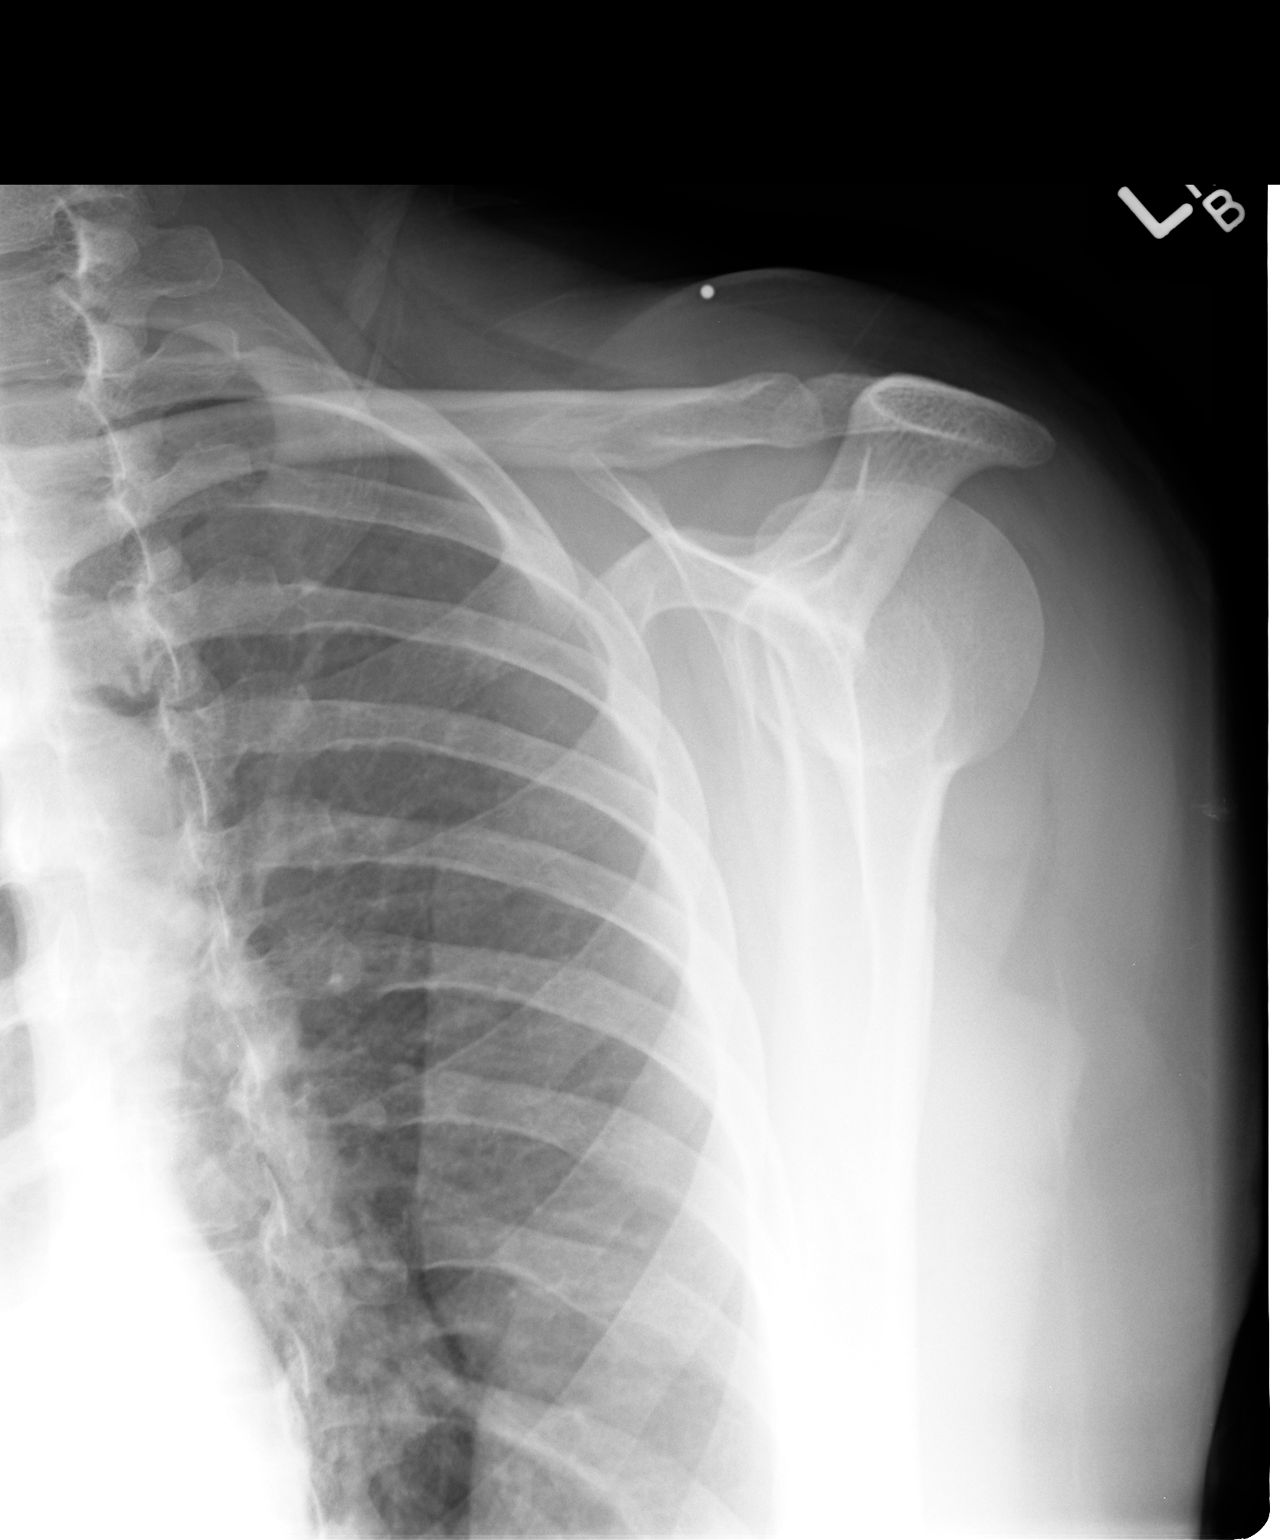

[view not recorded (3 of 3)]
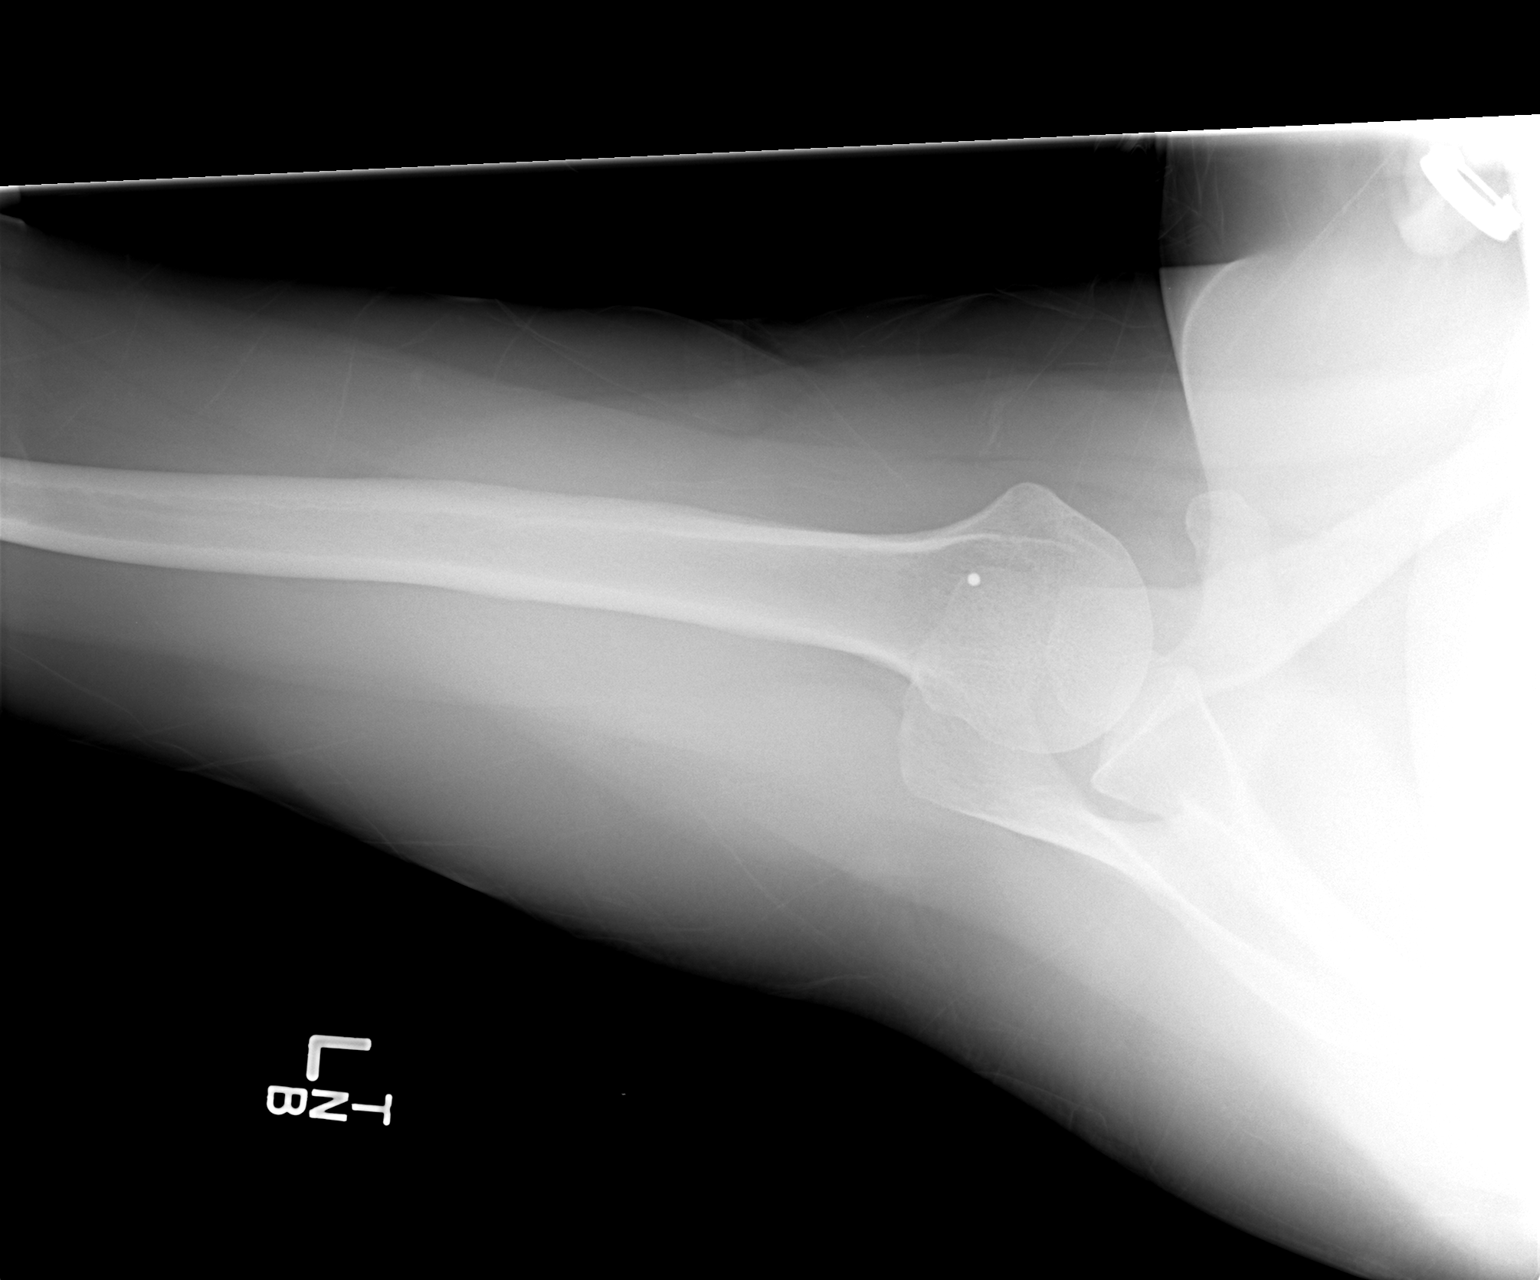

[3 of 3 positions shown; findings below may reference images not displayed]

FINDINGS: There is no evidence of fracture or dislocation. There is no
evidence of arthropathy or other focal bone abnormality. Soft
tissues are unremarkable.
IMPRESSION: Negative.

## 2015-01-02 ENCOUNTER — Telehealth: Payer: Self-pay | Admitting: Nurse Practitioner

## 2015-01-13 NOTE — Telephone Encounter (Signed)
Pt reported

## 2015-04-22 ENCOUNTER — Telehealth: Payer: Self-pay | Admitting: Nurse Practitioner

## 2015-06-21 ENCOUNTER — Ambulatory Visit (INDEPENDENT_AMBULATORY_CARE_PROVIDER_SITE_OTHER): Payer: BC Managed Care – PPO | Admitting: Nurse Practitioner

## 2015-06-21 ENCOUNTER — Encounter: Payer: Self-pay | Admitting: Nurse Practitioner

## 2015-06-21 VITALS — BP 127/85 | HR 79 | Temp 97.1°F | Ht 63.0 in | Wt 205.0 lb

## 2015-06-21 DIAGNOSIS — J069 Acute upper respiratory infection, unspecified: Secondary | ICD-10-CM | POA: Diagnosis not present

## 2015-06-21 DIAGNOSIS — J040 Acute laryngitis: Secondary | ICD-10-CM | POA: Diagnosis not present

## 2015-06-21 DIAGNOSIS — B9789 Other viral agents as the cause of diseases classified elsewhere: Principal | ICD-10-CM

## 2015-06-21 MED ORDER — METHYLPREDNISOLONE ACETATE 80 MG/ML IJ SUSP
80.0000 mg | Freq: Once | INTRAMUSCULAR | Status: AC
  Administered 2015-06-21: 80 mg via INTRAMUSCULAR

## 2015-06-21 MED ORDER — BENZONATATE 100 MG PO CAPS: 100.0000 mg | ORAL_CAPSULE | Freq: Three times a day (TID) | ORAL | Status: DC | PRN

## 2015-06-21 NOTE — Patient Instructions (Signed)
Laryngitis  Laryngitis is inflammation of your vocal cords. This causes hoarseness, coughing, loss of voice, sore throat, or a dry throat. Your vocal cords are two bands of muscles that are found in your throat. When you speak, these cords come together and vibrate. These vibrations come out through your mouth as sound. When your vocal cords are inflamed, your voice sounds different.  Laryngitis can be temporary (acute) or long-term (chronic). Most cases of acute laryngitis improve with time. Chronic laryngitis is laryngitis that lasts for more than three weeks.  CAUSES  Acute laryngitis may be caused by:   A viral infection.   Lots of talking, yelling, or singing. This is also called vocal strain.   Bacterial infections.  Chronic laryngitis may be caused by:   Vocal strain.   Injury to your vocal cords.   Acid reflux (gastroesophageal reflux disease or GERD).   Allergies.   Sinus infection.   Smoking.   Alcohol abuse.   Breathing in chemicals or dust.   Growths on the vocal cords.  RISK FACTORS  Risk factors for laryngitis include:   Smoking.   Alcohol abuse.   Having allergies.  SIGNS AND SYMPTOMS  Symptoms of laryngitis may include:   Low, hoarse voice.   Loss of voice.   Dry cough.   Sore throat.   Stuffy nose.  DIAGNOSIS  Laryngitis may be diagnosed by:   Physical exam.   Throat culture.   Blood test.   Laryngoscopy. This procedure allows your health care provider to look at your vocal cords with a mirror or viewing tube.  TREATMENT  Treatment for laryngitis depends on what is causing it. Usually, treatment involves resting your voice and using medicines to soothe your throat. However, if your laryngitis is caused by a bacterial infection, you may need to take antibiotic medicine. If your laryngitis is caused by a growth, you may need to have a procedure to remove it.  HOME CARE INSTRUCTIONS   Drink enough fluid to keep your urine clear or pale yellow.   Breathe in moist air. Use a  humidifier if you live in a dry climate.   Take medicines only as directed by your health care provider.   If you were prescribed an antibiotic medicine, finish it all even if you start to feel better.   Do not smoke cigarettes or electronic cigarettes. If you need help quitting, ask your health care provider.   Talk as little as possible. Also avoid whispering, which can cause vocal strain.   Write instead of talking. Do this until your voice is back to normal.  SEEK MEDICAL CARE IF:   You have a fever.   You have increasing pain.   You have difficulty swallowing.  SEEK IMMEDIATE MEDICAL CARE IF:   You cough up blood.   You have trouble breathing.     This information is not intended to replace advice given to you by your health care provider. Make sure you discuss any questions you have with your health care provider.     Document Released: 01/18/2005 Document Revised: 02/08/2014 Document Reviewed: 07/03/2013  [employer]vier Interactive Patient Education 2016 [employer]

## 2015-06-21 NOTE — Progress Notes (Signed)
   Subjective:    Patient ID: Judy Olson, female    DOB: [birth year], 40 y.o.   MRN: [id number]  HPI Patient in today c/o laryngitis- started yesterday- lots of sinus drainage- no fever - mild cough and congestion.    Review of Systems  Constitutional: Negative for fever.  HENT: Positive for congestion, rhinorrhea and voice change. Negative for ear pain, sinus pressure, sore throat and trouble swallowing.   Respiratory: Positive for cough (slight).   Cardiovascular: Negative.   Gastrointestinal: Negative.   Genitourinary: Negative.   Neurological: Negative.   Psychiatric/Behavioral: Negative.   All other systems reviewed and are negative.      Objective:   Physical Exam  Constitutional: She is oriented to person, place, and time. She appears well-developed and well-nourished. No distress.  HENT:  Right Ear: Hearing, tympanic membrane, external ear and ear canal normal.  Left Ear: Hearing, tympanic membrane, external ear and ear canal normal.  Nose: Mucosal edema and rhinorrhea present. Right sinus exhibits no maxillary sinus tenderness and no frontal sinus tenderness. Left sinus exhibits no maxillary sinus tenderness and no frontal sinus tenderness.  Mouth/Throat: Uvula is midline, oropharynx is clear and moist and mucous membranes are normal.  Neck: Normal range of motion. Neck supple.  Cardiovascular: Normal rate, regular rhythm and normal heart sounds.   Pulmonary/Chest: Effort normal and breath sounds normal.  Neurological: She is alert and oriented to person, place, and time.  Skin: Skin is warm.  Psychiatric: She has a normal mood and affect. Her behavior is normal. Judgment and thought content normal.    BP 127/85 mmHg  Pulse 79  Temp(Src) 97.1 F (36.2 C) (Oral)  Ht 5\' 3"  (1.6 m)  Wt 205 lb (92.987 kg)  BMI 36.32 kg/m2  LMP 06/14/2015      Assessment & Plan:   1. Viral URI with cough   2. Laryngitis, acute    Meds ordered this encounter  Medications  .  Multiple Vitamin (STRESS FORMULA 500/BIOTIN PO)    Sig: Take 500 Units by mouth daily.  Marland Kitchen OVER THE COUNTER MEDICATION    Sig: Magnesium and Co - Q- 10  . benzonatate (TESSALON PERLES) 100 MG capsule    Sig: Take 1 capsule (100 mg total) by mouth 3 (three) times daily as needed for cough.    Dispense:  20 capsule    Refill:  0    Order Specific Question:  Supervising Provider    Answer:  Chipper Herb [[id number]]  . methylPREDNISolone acetate (DEPO-MEDROL) injection 80 mg    Sig:    1. Take meds as prescribed 2. Use a cool mist humidifier especially during the winter months and when heat has been humid. 3. Use saline nose sprays frequently 4. Saline irrigations of the nose can be very helpful if done frequently.  * 4X daily for 1 week*  * Use of a nettie pot can be helpful with this. Follow directions with this* 5. Drink plenty of fluids 6. Keep thermostat turn down low 7.For any cough or congestion  Use plain Mucinex- regular strength or max strength is fine   * Children- consult with Pharmacist for dosing 8. For fever or aces or pains- take tylenol or ibuprofen appropriate for age and weight.  * for fevers greater than 101 orally you may alternate ibuprofen and tylenol every  3 hours.   Mary-Margaret Hassell Done, FNP

## 2015-06-23 ENCOUNTER — Telehealth: Payer: Self-pay | Admitting: Nurse Practitioner

## 2015-06-23 MED ORDER — AZITHROMYCIN 250 MG PO TABS: ORAL_TABLET | ORAL | Status: DC

## 2015-06-23 NOTE — Telephone Encounter (Signed)
Zpak Prescription sent to pharmacy   

## 2015-10-28 ENCOUNTER — Encounter (INDEPENDENT_AMBULATORY_CARE_PROVIDER_SITE_OTHER): Payer: Self-pay

## 2015-10-28 ENCOUNTER — Ambulatory Visit (INDEPENDENT_AMBULATORY_CARE_PROVIDER_SITE_OTHER): Payer: BC Managed Care – PPO | Admitting: Physician Assistant

## 2015-10-28 ENCOUNTER — Encounter: Payer: Self-pay | Admitting: Physician Assistant

## 2015-10-28 VITALS — BP 144/89 | HR 79 | Temp 98.5°F | Ht 63.0 in | Wt 207.0 lb

## 2015-10-28 DIAGNOSIS — R05 Cough: Secondary | ICD-10-CM

## 2015-10-28 DIAGNOSIS — J309 Allergic rhinitis, unspecified: Secondary | ICD-10-CM | POA: Diagnosis not present

## 2015-10-28 DIAGNOSIS — J209 Acute bronchitis, unspecified: Secondary | ICD-10-CM | POA: Diagnosis not present

## 2015-10-28 DIAGNOSIS — J0100 Acute maxillary sinusitis, unspecified: Secondary | ICD-10-CM | POA: Diagnosis not present

## 2015-10-28 DIAGNOSIS — R059 Cough, unspecified: Secondary | ICD-10-CM

## 2015-10-28 MED ORDER — AZITHROMYCIN 250 MG PO TABS: ORAL_TABLET | ORAL | 0 refills | Status: DC

## 2015-10-28 MED ORDER — HYDROCODONE-HOMATROPINE 5-1.5 MG/5ML PO SYRP: 10.0000 mL | ORAL_SOLUTION | Freq: Four times a day (QID) | ORAL | 0 refills | Status: DC | PRN

## 2015-10-28 NOTE — Patient Instructions (Signed)

## 2015-10-28 NOTE — Progress Notes (Signed)
BP (!) 144/89   Pulse 79   Temp 98.5 F (36.9 C) (Oral)   Ht 5\' 3"  (1.6 m)   Wt 207 lb (93.9 kg)   BMI 36.67 kg/m    Subjective:    Patient ID: Judy Olson, female    DOB: [birth year], 40 y.o.   MRN: [id number]  HPI: Judy Olson is a 40 y.o. female presenting on 10/28/2015 for sneezing; teeth/jaw pain; and Adenopathy  Sick for a week and exposed to others that sick. Father with pneumonia.  Has copious postnasal drainage along with left maxillary pressure and pain in her teeth. She is significant swelling in her lymph nodes that she is able to feel. She has no change in her hearing at this time. She also is having severe cough for the past couple days. It is worse when she lays down at night. She has tried [employer] without any relief. She has no known history of asthma.  Relevant past medical, surgical, family and social history reviewed and updated as indicated. Interim medical history since our last visit reviewed. Allergies and medications reviewed and updated. DATA REVIEWED: CHART IN EPIC  Social History   Social History  . Marital status: Married    Spouse name: N/A  . Number of children: N/A  . Years of education: N/A   Occupational History  . Not on file.   Social History Main Topics  . Smoking status: Never Smoker  . Smokeless tobacco: Never Used  . Alcohol use No  . Drug use: No  . Sexual activity: Not on file   Other Topics Concern  . Not on file   Social History Narrative  . No narrative on file    Past Surgical History:  Procedure Laterality Date  . CHOLECYSTECTOMY  12/23/2010   Procedure: LAPAROSCOPIC CHOLECYSTECTOMY WITH INTRAOPERATIVE CHOLANGIOGRAM;  Surgeon: Willey Blade, MD;  Location: [facility];  Service: General;  Laterality: N/A;  LAP CHOLE WITH X-RAY  . WISDOM TOOTH EXTRACTION     in oral surgeons office    Family History  Problem Relation Age of Onset  . Cancer Mother     ovarian  . Mitral valve prolapse Mother   . Cancer  Maternal Grandmother     pancreatic, lymphoma    Review of Systems  Constitutional: Positive for fatigue and fever.  HENT: Positive for ear pain, postnasal drip, sinus pressure and sore throat.   Eyes: Negative.   Respiratory: Positive for cough and wheezing.   Cardiovascular: Negative.   Gastrointestinal: Negative.   Genitourinary: Negative.       Medication List       Accurate as of 10/28/15  9:18 AM. Always use your most recent med list.          azithromycin 250 MG tablet Commonly known as:  ZITHROMAX Z-PAK As directed   BIOTIN PO Take 1,000 mg by mouth.   cholecalciferol 1000 units tablet Commonly known as:  VITAMIN D Take 1,000 Units by mouth daily.   finasteride 5 MG tablet Commonly known as:  PROSCAR Take 1/2pill daily   GIANVI 3-0.02 MG tablet Generic drug:  drospirenone-ethinyl estradiol   HYDROcodone-homatropine 5-1.5 MG/5ML syrup Commonly known as:  HYCODAN Take 10 mLs by mouth every 6 (six) hours as needed for cough.   loratadine 10 MG tablet Commonly known as:  CLARITIN Take 10 mg by mouth daily.   OVER THE COUNTER MEDICATION Magnesium and Co - Q- 10   STRESS FORMULA 500/BIOTIN PO Take 500  Units by mouth daily.   tretinoin 0.025 % cream Commonly known as:  RETIN-A Apply to the face in the evening          Objective:    BP (!) 144/89   Pulse 79   Temp 98.5 F (36.9 C) (Oral)   Ht 5\' 3"  (1.6 m)   Wt 207 lb (93.9 kg)   BMI 36.67 kg/m   Allergies  Allergen Reactions  . Banana Other (See Comments)    Chest tightness     Wt Readings from Last 3 Encounters:  10/28/15 207 lb (93.9 kg)  06/21/15 205 lb (93 kg)  10/12/14 205 lb (93 kg)    Physical Exam  Constitutional: She is oriented to person, place, and time. She appears well-developed and well-nourished.  HENT:  Head: Normocephalic and atraumatic.  Right Ear: Tympanic membrane and external ear normal. No middle ear effusion.  Left Ear: Tympanic membrane and external ear  normal.  No middle ear effusion.  Nose: Mucosal edema and rhinorrhea present. Right sinus exhibits no maxillary sinus tenderness. Left sinus exhibits maxillary sinus tenderness.  Mouth/Throat: Uvula is midline. Posterior oropharyngeal erythema present.  Positive anterior cervical lymphadenopathy  Eyes: Conjunctivae and EOM are normal. Pupils are equal, round, and reactive to light. Right eye exhibits no discharge. Left eye exhibits no discharge.  Neck: Normal range of motion.  Cardiovascular: Normal rate, regular rhythm and normal heart sounds.   Pulmonary/Chest: Effort normal and breath sounds normal. No respiratory distress. She has no wheezes.  Abdominal: Soft.  Lymphadenopathy:    She has no cervical adenopathy.  Neurological: She is alert and oriented to person, place, and time.  Skin: Skin is warm and dry.  Psychiatric: She has a normal mood and affect.  Nursing note and vitals reviewed.   Results for orders placed or performed in visit on 10/12/14  Culture, Group A Strep  Result Value Ref Range   Strep A Culture Negative   POCT rapid strep A  Result Value Ref Range   Rapid Strep A Screen Negative Negative      Assessment & Plan:  1. Acute bronchitis, unspecified organism - azithromycin (ZITHROMAX Z-PAK) 250 MG tablet; As directed  Dispense: 6 tablet; Refill: 0 - HYDROcodone-homatropine (HYCODAN) 5-1.5 MG/5ML syrup; Take 10 mLs by mouth every 6 (six) hours as needed for cough.  Dispense: 240 mL; Refill: 0  2. Acute maxillary sinusitis, recurrence not specified  3. Cough  4. Allergic rhinitis, unspecified allergic rhinitis type - loratadine (CLARITIN) 10 MG tablet; Take 10 mg by mouth daily.  Educational handout given for sinusitis.  Terald Sleeper PA-C [place] [address]  [place], [place] [address] [phone]   10/28/2015, 9:18 AM

## 2015-10-29 ENCOUNTER — Emergency Department (HOSPITAL_COMMUNITY)
Admission: EM | Admit: 2015-10-29 | Discharge: 2015-10-29 | Disposition: A | Discharge status: Home / Self Care | Payer: BC Managed Care – PPO | Attending: Emergency Medicine | Admitting: Emergency Medicine

## 2015-10-29 ENCOUNTER — Emergency Department (HOSPITAL_COMMUNITY): Payer: BC Managed Care – PPO

## 2015-10-29 ENCOUNTER — Encounter (HOSPITAL_COMMUNITY): Payer: Self-pay | Admitting: *Deleted

## 2015-10-29 DIAGNOSIS — J209 Acute bronchitis, unspecified: Secondary | ICD-10-CM | POA: Insufficient documentation

## 2015-10-29 DIAGNOSIS — R112 Nausea with vomiting, unspecified: Secondary | ICD-10-CM

## 2015-10-29 DIAGNOSIS — R739 Hyperglycemia, unspecified: Secondary | ICD-10-CM

## 2015-10-29 DIAGNOSIS — E860 Dehydration: Secondary | ICD-10-CM | POA: Insufficient documentation

## 2015-10-29 DIAGNOSIS — R0602 Shortness of breath: Secondary | ICD-10-CM | POA: Diagnosis present

## 2015-10-29 LAB — COMPREHENSIVE METABOLIC PANEL
ALBUMIN: 3.5 g/dL (ref 3.5–5.0)
ALT: 10 U/L — AB (ref 14–54)
ANION GAP: 8 (ref 5–15)
AST: 20 U/L (ref 15–41)
Alkaline Phosphatase: 68 U/L (ref 38–126)
BUN: 14 mg/dL (ref 6–20)
CHLORIDE: 104 mmol/L (ref 101–111)
CO2: 24 mmol/L (ref 22–32)
Calcium: 9 mg/dL (ref 8.9–10.3)
Creatinine, Ser: 0.73 mg/dL (ref 0.44–1.00)
GFR calc non Af Amer: >60 mL/min (ref >60)
Glucose, Bld: 177 mg/dL — ABNORMAL HIGH (ref 65–99)
Potassium: 3.7 mmol/L (ref 3.5–5.1)
SODIUM: 136 mmol/L (ref 135–145)
Total Bilirubin: 0.2 mg/dL — ABNORMAL LOW (ref 0.3–1.2)
Total Protein: 7.4 g/dL (ref 6.5–8.1)

## 2015-10-29 LAB — CBC WITH DIFFERENTIAL/PLATELET
Basophils Absolute: 0 10*3/uL (ref 0.0–0.1)
Basophils Relative: 0 %
Eosinophils Absolute: 0 10*3/uL (ref 0.0–0.7)
Eosinophils Relative: 0 %
HEMATOCRIT: 38.9 % (ref 36.0–46.0)
HEMOGLOBIN: 13.3 g/dL (ref 12.0–15.0)
LYMPHS ABS: 1 10*3/uL (ref 0.7–4.0)
Lymphocytes Relative: 11 %
MCH: 28.5 pg (ref 26.0–34.0)
MCHC: 34.2 g/dL (ref 30.0–36.0)
MCV: 83.5 fL (ref 78.0–100.0)
MONOS PCT: 5 %
Monocytes Absolute: 0.4 10*3/uL (ref 0.1–1.0)
NEUTROS ABS: 7.5 10*3/uL (ref 1.7–7.7)
NEUTROS PCT: 84 %
Platelets: 256 10*3/uL (ref 150–400)
RBC: 4.66 MIL/uL (ref 3.87–5.11)
RDW: 12.4 % (ref 11.5–15.5)
WBC: 9 10*3/uL (ref 4.0–10.5)

## 2015-10-29 LAB — URINALYSIS, ROUTINE W REFLEX MICROSCOPIC
BILIRUBIN URINE: NEGATIVE
Glucose, UA: NEGATIVE mg/dL
Ketones, ur: NEGATIVE mg/dL
Leukocytes, UA: NEGATIVE
Nitrite: NEGATIVE
PH: 5.5 (ref 5.0–8.0)

## 2015-10-29 LAB — LIPASE, BLOOD: LIPASE: 19 U/L (ref 11–51)

## 2015-10-29 LAB — URINE MICROSCOPIC-ADD ON

## 2015-10-29 LAB — PREGNANCY, URINE: Preg Test, Ur: NEGATIVE

## 2015-10-29 MED ORDER — METHYLPREDNISOLONE SODIUM SUCC 125 MG IJ SOLR
125.0000 mg | Freq: Once | INTRAMUSCULAR | Status: AC
  Administered 2015-10-29: 125 mg via INTRAVENOUS
  Filled 2015-10-29: qty 2

## 2015-10-29 MED ORDER — PREDNISONE 10 MG (21) PO TBPK: 10.0000 mg | ORAL_TABLET | Freq: Every day | ORAL | 0 refills | Status: DC

## 2015-10-29 MED ORDER — PROMETHAZINE HCL 25 MG PO TABS: 25.0000 mg | ORAL_TABLET | Freq: Four times a day (QID) | ORAL | 0 refills | Status: DC | PRN

## 2015-10-29 MED ORDER — ALBUTEROL SULFATE HFA 108 (90 BASE) MCG/ACT IN AERS
1.0000 | INHALATION_SPRAY | RESPIRATORY_TRACT | Status: DC | PRN
  Administered 2015-10-29: 2 via RESPIRATORY_TRACT
  Filled 2015-10-29: qty 6.7

## 2015-10-29 MED ORDER — PROMETHAZINE HCL 25 MG/ML IJ SOLN
25.0000 mg | Freq: Once | INTRAMUSCULAR | Status: AC
  Administered 2015-10-29: 25 mg via INTRAVENOUS
  Filled 2015-10-29: qty 1

## 2015-10-29 MED ORDER — ALBUTEROL SULFATE (2.5 MG/3ML) 0.083% IN NEBU
5.0000 mg | INHALATION_SOLUTION | Freq: Once | RESPIRATORY_TRACT | Status: AC
  Administered 2015-10-29: 5 mg via RESPIRATORY_TRACT
  Filled 2015-10-29: qty 6

## 2015-10-29 MED ORDER — SODIUM CHLORIDE 0.9 % IV BOLUS (SEPSIS)
1000.0000 mL | Freq: Once | INTRAVENOUS | Status: AC
  Administered 2015-10-29: 1000 mL via INTRAVENOUS

## 2015-10-29 MED ORDER — ONDANSETRON HCL 4 MG/2ML IJ SOLN
4.0000 mg | Freq: Once | INTRAMUSCULAR | Status: AC
  Administered 2015-10-29: 4 mg via INTRAVENOUS
  Filled 2015-10-29: qty 2

## 2015-10-29 MED ORDER — AEROCHAMBER PLUS FLO-VU MEDIUM MISC: 1.0000 | Freq: Once | Status: DC

## 2015-10-29 MED ORDER — KETOROLAC TROMETHAMINE 30 MG/ML IJ SOLN
30.0000 mg | Freq: Once | INTRAMUSCULAR | Status: AC
  Administered 2015-10-29: 30 mg via INTRAVENOUS
  Filled 2015-10-29: qty 1

## 2015-10-29 NOTE — ED Triage Notes (Signed)
Pt comes in for vomiting starting today. She was dx with bronchitis yesterday and began taking hydrocodone cough medication. Shortly after that she began vomiting. NAD noted. No nausea now.

## 2015-10-29 NOTE — ED Notes (Signed)
Pt receiving breathign tx at this time

## 2015-10-29 NOTE — ED Notes (Signed)
Pt taken to xray 

## 2015-10-29 NOTE — ED Provider Notes (Signed)
[facility] DEPT Provider Note   CSN: [id number] Arrival date & time: 10/29/15  1159  By signing my name below, I, Rayna Sexton, attest that this documentation has been prepared under the direction and in the presence of Isla Pence, MD. Electronically Signed: Rayna Sexton, ED Scribe. 10/29/15. Marland Kitchen   History   Chief Complaint Chief Complaint  Patient presents with  . Emesis    HPI HPI Comments: Judy Olson is a 40 y.o. female who presents to the Emergency Department complaining of mild SOB onset earlier today. Pt states she was dx with bronchitis yesterday and began taking a hydrocodone cough medication prior to the onset of her SOB. She reports associated n/v. Her father is currently hospitalized for pneumonia and she has been visiting him frequently. No other associated symptoms at this time.    The history is provided by the patient. No language interpreter was used.    Past Medical History:  Diagnosis Date  . Anxiety   . Bronchitis    7 weeks ago requiring antibiotics amd steroids  . Constipation   . Generalized headaches   . Hiatal hernia    states was diagnosed before the gallbladder diagnosis    Patient Active Problem List   Diagnosis Date Noted  . Allergic rhinitis 10/28/2015  . PVC's (premature ventricular contractions) 01/18/2014    Past Surgical History:  Procedure Laterality Date  . CHOLECYSTECTOMY  12/23/2010   Procedure: LAPAROSCOPIC CHOLECYSTECTOMY WITH INTRAOPERATIVE CHOLANGIOGRAM;  Surgeon: Willey Blade, MD;  Location: [facility];  Service: General;  Laterality: N/A;  LAP CHOLE WITH X-RAY  . WISDOM TOOTH EXTRACTION     in oral surgeons office    OB History    No data available       Home Medications    Prior to Admission medications   Medication Sig Start Date End Date Taking? Authorizing Provider  azithromycin (ZITHROMAX Z-PAK) 250 MG tablet As directed 10/28/15   Terald Sleeper, PA  BIOTIN PO Take 1,000 mg by mouth.     Historical Provider, MD  cholecalciferol (VITAMIN D) 1000 UNITS tablet Take 1,000 Units by mouth daily.    Historical Provider, MD  finasteride (PROSCAR) 5 MG tablet Take 1/2pill daily 03/01/13   Historical Provider, MD  Quincy Carnes 3-0.02 MG tablet  12/08/13   Historical Provider, MD  HYDROcodone-homatropine (HYCODAN) 5-1.5 MG/5ML syrup Take 10 mLs by mouth every 6 (six) hours as needed for cough. 10/28/15   Terald Sleeper, PA  loratadine (CLARITIN) 10 MG tablet Take 10 mg by mouth daily.    Historical Provider, MD  Multiple Vitamin (STRESS FORMULA 500/BIOTIN PO) Take 500 Units by mouth daily.    Historical Provider, MD  OVER THE COUNTER MEDICATION Magnesium and Co - Q- 10    Historical Provider, MD  predniSONE (STERAPRED UNI-PAK 21 TAB) 10 MG (21) TBPK tablet Take 1 tablet (10 mg total) by mouth daily. Take 6 tabs by mouth daily  for 2 days, then 5 tabs for 2 days, then 4 tabs for 2 days, then 3 tabs for 2 days, 2 tabs for 2 days, then 1 tab by mouth daily for 2 days 10/29/15   Isla Pence, MD  promethazine (PHENERGAN) 25 MG tablet Take 1 tablet (25 mg total) by mouth every 6 (six) hours as needed for nausea or vomiting. 10/29/15   Isla Pence, MD  tretinoin (RETIN-A) 0.025 % cream Apply to the face in the evening 06/17/14   Historical Provider, MD    Family History  Family History  Problem Relation Age of Onset  . Cancer Mother     ovarian  . Mitral valve prolapse Mother   . Cancer Maternal Grandmother     pancreatic, lymphoma    Social History Social History  Substance Use Topics  . Smoking status: Never Smoker  . Smokeless tobacco: Never Used  . Alcohol use No     Allergies   Banana   Review of Systems Review of Systems  Constitutional: Negative for chills and fever.  Respiratory: Positive for shortness of breath.   Gastrointestinal: Positive for nausea and vomiting.  All other systems reviewed and are negative.    Physical Exam Updated Vital Signs BP 132/78   Pulse 89    Temp 98.1 F (36.7 C) (Oral)   Resp 18   Ht 5\' 4"  (1.626 m)   Wt 207 lb (93.9 kg)   SpO2 100%   BMI 35.53 kg/m   Physical Exam  Constitutional: She is oriented to person, place, and time. She appears well-developed and well-nourished. No distress.  HENT:  Head: Normocephalic and atraumatic.  Eyes: EOM are normal.  Neck: Normal range of motion.  Cardiovascular: Normal rate, regular rhythm and normal heart sounds.  Exam reveals no gallop and no friction rub.   No murmur heard. Pulmonary/Chest: Effort normal. No respiratory distress. She has wheezes.  Abdominal: Soft. There is no tenderness.  Musculoskeletal: Normal range of motion.  Neurological: She is alert and oriented to person, place, and time.  Skin: Skin is warm and dry. She is not diaphoretic.  Psychiatric: She has a normal mood and affect.  Nursing note and vitals reviewed.    ED Treatments / Results  Labs (all labs ordered are listed, but only abnormal results are displayed) Labs Reviewed  COMPREHENSIVE METABOLIC PANEL - Abnormal; Notable for the following:       Result Value   Glucose, Bld 177 (*)    ALT 10 (*)    Total Bilirubin 0.2 (*)    All other components within normal limits  URINALYSIS, ROUTINE W REFLEX MICROSCOPIC (NOT AT [facility]) - Abnormal; Notable for the following:    Specific Gravity, Urine >1.030 (*)    Hgb urine dipstick LARGE (*)    Protein, ur TRACE (*)    All other components within normal limits  URINE MICROSCOPIC-ADD ON - Abnormal; Notable for the following:    Squamous Epithelial / LPF 0-5 (*)    Bacteria, UA RARE (*)    All other components within normal limits  CBC WITH DIFFERENTIAL/PLATELET  PREGNANCY, URINE  LIPASE, BLOOD    EKG  EKG Interpretation None       Radiology Dg Chest 2 View  Result Date: 10/29/2015 CLINICAL DATA:  Onset of vomiting today, also lethargy. Patient was diagnosed with bronchitis yesterday. EXAM: CHEST  2 VIEW COMPARISON:  PA and lateral chest x-ray  of December 19, 2012 FINDINGS: The lungs are adequately inflated. The interstitial markings are coarse but not greatly changed. There is no alveolar infiltrate or pleural effusion. The heart and pulmonary vascularity are normal. The mediastinum is normal in width. The bony thorax exhibits no acute abnormality. IMPRESSION: Mild chronic bronchitic changes.  No alveolar pneumonia nor CHF. Electronically Signed   By: David  Martinique M.D.   On: 10/29/2015 13:09    Procedures Procedures  DIAGNOSTIC STUDIES: Oxygen Saturation is 97% on RA, normal by my interpretation.    COORDINATION OF CARE: 12:05 PM Discussed next steps with pt. Pt verbalized understanding and  is agreeable with the plan.    Medications Ordered in ED Medications  albuterol (PROVENTIL HFA;VENTOLIN HFA) 108 (90 Base) MCG/ACT inhaler 1-2 puff (not administered)  AEROCHAMBER PLUS FLO-VU MEDIUM MISC 1 each (not administered)  sodium chloride 0.9 % bolus 1,000 mL (0 mLs Intravenous Stopped 10/29/15 1336)  ondansetron (ZOFRAN) injection 4 mg (4 mg Intravenous Given 10/29/15 1222)  albuterol (PROVENTIL) (2.5 MG/3ML) 0.083% nebulizer solution 5 mg (5 mg Nebulization Given 10/29/15 1224)  methylPREDNISolone sodium succinate (SOLU-MEDROL) 125 mg/2 mL injection 125 mg (125 mg Intravenous Given 10/29/15 1221)  promethazine (PHENERGAN) injection 25 mg (25 mg Intravenous Given 10/29/15 1334)  ketorolac (TORADOL) 30 MG/ML injection 30 mg (30 mg Intravenous Given 10/29/15 1334)     Initial Impression / Assessment and Plan / ED Course  I have reviewed the triage vital signs and the nursing notes.  Pertinent labs & imaging results that were available during my care of the patient were reviewed by me and considered in my medical decision making (see chart for details).  Clinical Course    Pt is feeling much better.  She was told about the elevated blood sugar and knows that needs to be rechecked.  She was also told that steroids can increase the blood  sugar and was given diet instructions.    Final Clinical Impressions(s) / ED Diagnoses   Final diagnoses:  Acute bronchitis, unspecified organism  Hyperglycemia  Non-intractable vomiting with nausea, vomiting of unspecified type  Dehydration    New Prescriptions New Prescriptions   PREDNISONE (STERAPRED UNI-PAK 21 TAB) 10 MG (21) TBPK TABLET    Take 1 tablet (10 mg total) by mouth daily. Take 6 tabs by mouth daily  for 2 days, then 5 tabs for 2 days, then 4 tabs for 2 days, then 3 tabs for 2 days, 2 tabs for 2 days, then 1 tab by mouth daily for 2 days   PROMETHAZINE (PHENERGAN) 25 MG TABLET    Take 1 tablet (25 mg total) by mouth every 6 (six) hours as needed for nausea or vomiting.     Isla Pence, MD 10/29/15 [phone]

## 2015-10-29 NOTE — ED Notes (Signed)
Pt returned from xray. States could not tell that breathing tx helped her.

## 2015-10-31 ENCOUNTER — Ambulatory Visit (INDEPENDENT_AMBULATORY_CARE_PROVIDER_SITE_OTHER): Payer: BC Managed Care – PPO | Admitting: Physician Assistant

## 2015-10-31 ENCOUNTER — Encounter: Payer: Self-pay | Admitting: Physician Assistant

## 2015-10-31 VITALS — BP 143/96 | HR 89 | Temp 97.0°F | Ht 64.0 in | Wt 206.8 lb

## 2015-10-31 DIAGNOSIS — R05 Cough: Secondary | ICD-10-CM | POA: Diagnosis not present

## 2015-10-31 DIAGNOSIS — Z888 Allergy status to other drugs, medicaments and biological substances status: Secondary | ICD-10-CM | POA: Diagnosis not present

## 2015-10-31 DIAGNOSIS — T7840XD Allergy, unspecified, subsequent encounter: Secondary | ICD-10-CM

## 2015-10-31 DIAGNOSIS — R059 Cough, unspecified: Secondary | ICD-10-CM

## 2015-10-31 DIAGNOSIS — R739 Hyperglycemia, unspecified: Secondary | ICD-10-CM | POA: Diagnosis not present

## 2015-10-31 MED ORDER — FINASTERIDE 5 MG PO TABS: 5.0000 mg | ORAL_TABLET | Freq: Every day | ORAL | 11 refills | Status: DC

## 2015-10-31 NOTE — Patient Instructions (Addendum)

## 2015-10-31 NOTE — Progress Notes (Signed)
BP (!) 143/96   Pulse 89   Temp 97 F (36.1 C) (Oral)   Ht 5\' 4"  (1.626 m)   Wt 206 lb 12.8 oz (93.8 kg)   BMI 35.50 kg/m    Subjective:    Patient ID: Judy Olson, female    DOB: May 16, 1975, 40 y.o.   MRN: [id number]  HPI: Syera Tewell is a 40 y.o. female presenting on 10/31/2015 for ER follow up Latrelle Dodrill reaction to cough syrup. BS was elevated in the ER also ) The patient is feeling much better from her allergic reaction to hydrocodone. This will be documented in her chart is a new allergy. While she was there her blood sugar was somewhat elevated female concerned about this she will will have an A1c performed today since that we can further track this trend. She is also using Proventil from the emergency room. She states that her cold is feeling somewhat better she is to complete her medication. She is also finishing out a prednisone pack that was given to her there. There are no other complaints at this time.  Relevant past medical, surgical, family and social history reviewed and updated as indicated. Interim medical history since our last visit reviewed. Allergies and medications reviewed and updated. DATA REVIEWED: CHART IN EPIC  Social History   Social History  . Marital status: Married    Spouse name: N/A  . Number of children: N/A  . Years of education: N/A   Occupational History  . Not on file.   Social History Main Topics  . Smoking status: Never Smoker  . Smokeless tobacco: Never Used  . Alcohol use No  . Drug use: No  . Sexual activity: Not on file   Other Topics Concern  . Not on file   Social History Narrative  . No narrative on file    Past Surgical History:  Procedure Laterality Date  . CHOLECYSTECTOMY  12/23/2010   Procedure: LAPAROSCOPIC CHOLECYSTECTOMY WITH INTRAOPERATIVE CHOLANGIOGRAM;  Surgeon: Willey Blade, MD;  Location: [facility];  Service: General;  Laterality: N/A;  LAP CHOLE WITH X-RAY  . WISDOM TOOTH EXTRACTION     in oral  surgeons office    Family History  Problem Relation Age of Onset  . Cancer Mother     ovarian  . Mitral valve prolapse Mother   . Cancer Maternal Grandmother     pancreatic, lymphoma    Review of Systems  Constitutional: Negative.  Negative for activity change, fatigue and fever.  HENT: Positive for sinus pressure and sore throat.   Eyes: Negative.   Respiratory: Positive for cough and wheezing.   Cardiovascular: Negative.  Negative for chest pain.  Gastrointestinal: Negative.  Negative for abdominal pain.  Endocrine: Negative.   Genitourinary: Negative.  Negative for dysuria.  Musculoskeletal: Negative.   Skin: Negative.   Neurological: Negative.       Medication List       Accurate as of 10/31/15  5:00 PM. Always use your most recent med list.          azithromycin 250 MG tablet Commonly known as:  ZITHROMAX Z-PAK As directed   BIOTIN PO Take 1,000 mg by mouth.   cholecalciferol 1000 units tablet Commonly known as:  VITAMIN D Take 1,000 Units by mouth daily.   finasteride 5 MG tablet Commonly known as:  PROSCAR Take 1 tablet (5 mg total) by mouth daily. Take 1/2pill daily   GIANVI 3-0.02 MG tablet Generic drug:  drospirenone-ethinyl estradiol  HYDROcodone-homatropine 5-1.5 MG/5ML syrup Commonly known as:  HYCODAN Take 10 mLs by mouth every 6 (six) hours as needed for cough.   loratadine 10 MG tablet Commonly known as:  CLARITIN Take 10 mg by mouth daily.   OVER THE COUNTER MEDICATION Magnesium and Co - Q- 10   predniSONE 10 MG (21) Tbpk tablet Commonly known as:  STERAPRED UNI-PAK 21 TAB Take 1 tablet (10 mg total) by mouth daily. Take 6 tabs by mouth daily  for 2 days, then 5 tabs for 2 days, then 4 tabs for 2 days, then 3 tabs for 2 days, 2 tabs for 2 days, then 1 tab by mouth daily for 2 days   promethazine 25 MG tablet Commonly known as:  PHENERGAN Take 1 tablet (25 mg total) by mouth every 6 (six) hours as needed for nausea or vomiting.     STRESS FORMULA 500/BIOTIN PO Take 500 Units by mouth daily.   tretinoin 0.025 % cream Commonly known as:  RETIN-A Apply to the face in the evening          Objective:    BP (!) 143/96   Pulse 89   Temp 97 F (36.1 C) (Oral)   Ht 5\' 4"  (1.626 m)   Wt 206 lb 12.8 oz (93.8 kg)   BMI 35.50 kg/m   Allergies  Allergen Reactions  . Banana Other (See Comments)    Chest tightness     Wt Readings from Last 3 Encounters:  10/31/15 206 lb 12.8 oz (93.8 kg)  10/29/15 207 lb (93.9 kg)  10/28/15 207 lb (93.9 kg)    Physical Exam  Constitutional: She is oriented to person, place, and time. She appears well-developed and well-nourished.  HENT:  Head: Normocephalic and atraumatic.  Right Ear: A middle ear effusion is present.  Left Ear: A middle ear effusion is present.  Nose: Mucosal edema present. Right sinus exhibits no frontal sinus tenderness. Left sinus exhibits no frontal sinus tenderness.  Mouth/Throat: Posterior oropharyngeal erythema present. No oropharyngeal exudate or tonsillar abscesses.  Eyes: Conjunctivae and EOM are normal. Pupils are equal, round, and reactive to light.  Neck: Normal range of motion.  Cardiovascular: Normal rate, regular rhythm, normal heart sounds and intact distal pulses.   Pulmonary/Chest: Effort normal and breath sounds normal.  Abdominal: Soft. Bowel sounds are normal.  Neurological: She is alert and oriented to person, place, and time. She has normal reflexes.  Skin: Skin is warm and dry. No rash noted.  Psychiatric: She has a normal mood and affect. Her behavior is normal. Judgment and thought content normal.  Nursing note and vitals reviewed.   Results for orders placed or performed during the hospital encounter of 10/29/15  Comprehensive metabolic panel  Result Value Ref Range   Sodium 136 135 - 145 mmol/L   Potassium 3.7 3.5 - 5.1 mmol/L   Chloride 104 101 - 111 mmol/L   CO2 24 22 - 32 mmol/L   Glucose, Bld 177 (H) 65 - 99 mg/dL    BUN 14 6 - 20 mg/dL   Creatinine, Ser 2.950.73 0.44 - 1.00 mg/dL   Calcium 9.0 8.9 - 62.110.3 mg/dL   Total Protein 7.4 6.5 - 8.1 g/dL   Albumin 3.5 3.5 - 5.0 g/dL   AST 20 15 - 41 U/L   ALT 10 (L) 14 - 54 U/L   Alkaline Phosphatase 68 38 - 126 U/L   Total Bilirubin 0.2 (L) 0.3 - 1.2 mg/dL   GFR calc non  Af Amer >60 >60 mL/min   GFR calc Af Amer >60 >60 mL/min   Anion gap 8 5 - 15  CBC with Differential  Result Value Ref Range   WBC 9.0 4.0 - 10.5 K/uL   RBC 4.66 3.87 - 5.11 MIL/uL   Hemoglobin 13.3 12.0 - 15.0 g/dL   HCT 38.9 36.0 - 46.0 %   MCV 83.5 78.0 - 100.0 fL   MCH 28.5 26.0 - 34.0 pg   MCHC 34.2 30.0 - 36.0 g/dL   RDW 12.4 11.5 - 15.5 %   Platelets 256 150 - 400 K/uL   Neutrophils Relative % 84 %   Neutro Abs 7.5 1.7 - 7.7 K/uL   Lymphocytes Relative 11 %   Lymphs Abs 1.0 0.7 - 4.0 K/uL   Monocytes Relative 5 %   Monocytes Absolute 0.4 0.1 - 1.0 K/uL   Eosinophils Relative 0 %   Eosinophils Absolute 0.0 0.0 - 0.7 K/uL   Basophils Relative 0 %   Basophils Absolute 0.0 0.0 - 0.1 K/uL  Urinalysis, Routine w reflex microscopic  Result Value Ref Range   Color, Urine YELLOW YELLOW   APPearance CLEAR CLEAR   Specific Gravity, Urine >1.030 (H) 1.005 - 1.030   pH 5.5 5.0 - 8.0   Glucose, UA NEGATIVE NEGATIVE mg/dL   Hgb urine dipstick LARGE (A) NEGATIVE   Bilirubin Urine NEGATIVE NEGATIVE   Ketones, ur NEGATIVE NEGATIVE mg/dL   Protein, ur TRACE (A) NEGATIVE mg/dL   Nitrite NEGATIVE NEGATIVE   Leukocytes, UA NEGATIVE NEGATIVE  Pregnancy, urine  Result Value Ref Range   Preg Test, Ur NEGATIVE NEGATIVE  Lipase, blood  Result Value Ref Range   Lipase 19 11 - 51 U/L  Urine microscopic-add on  Result Value Ref Range   Squamous Epithelial / LPF 0-5 (A) NONE SEEN   WBC, UA 0-5 0 - 5 WBC/hpf   RBC / HPF 6-30 0 - 5 RBC/hpf   Bacteria, UA RARE (A) NONE SEEN      Assessment & Plan:   1. Hyperglycemia Carb counting information - Hemoglobin A1C  2. Allergic reaction,  subsequent encounter  3. Cough  Continue all other maintenance medications as listed above.  Follow up plan: Return in about 8 weeks (around 12/24/2015) for medicines.   Orders Placed This Encounter  Procedures  . Hemoglobin A1C    Educational handout given for carb counting  Terald Sleeper PA-C [facility] [address]  [place], [place] [address] [phone]   10/31/2015, 5:00 PM

## 2015-11-01 LAB — HEMOGLOBIN A1C
Est. average glucose Bld gHb Est-mCnc: 114 mg/dL
HEMOGLOBIN A1C: 5.6 % (ref 4.8–5.6)

## 2015-11-04 ENCOUNTER — Telehealth: Payer: Self-pay | Admitting: Physician Assistant

## 2015-11-04 MED ORDER — FINASTERIDE 5 MG PO TABS: 2.5000 mg | ORAL_TABLET | Freq: Every day | ORAL | 11 refills | Status: DC

## 2015-11-04 NOTE — Telephone Encounter (Signed)
sent 

## 2015-11-11 ENCOUNTER — Ambulatory Visit: Payer: BC Managed Care – PPO | Admitting: Physician Assistant

## 2015-12-24 ENCOUNTER — Ambulatory Visit: Payer: BC Managed Care – PPO | Admitting: Physician Assistant

## 2016-01-16 ENCOUNTER — Ambulatory Visit (INDEPENDENT_AMBULATORY_CARE_PROVIDER_SITE_OTHER): Payer: BC Managed Care – PPO | Admitting: Physician Assistant

## 2016-01-16 ENCOUNTER — Encounter (INDEPENDENT_AMBULATORY_CARE_PROVIDER_SITE_OTHER): Payer: Self-pay

## 2016-01-16 ENCOUNTER — Encounter: Payer: Self-pay | Admitting: Physician Assistant

## 2016-01-16 VITALS — BP 128/83 | HR 82 | Temp 97.5°F | Ht 64.0 in | Wt 211.2 lb

## 2016-01-16 DIAGNOSIS — G4700 Insomnia, unspecified: Secondary | ICD-10-CM | POA: Diagnosis not present

## 2016-01-16 DIAGNOSIS — N3001 Acute cystitis with hematuria: Secondary | ICD-10-CM | POA: Diagnosis not present

## 2016-01-16 DIAGNOSIS — R300 Dysuria: Secondary | ICD-10-CM | POA: Diagnosis not present

## 2016-01-16 DIAGNOSIS — K5901 Slow transit constipation: Secondary | ICD-10-CM | POA: Diagnosis not present

## 2016-01-16 DIAGNOSIS — Z0000 Encounter for general adult medical examination without abnormal findings: Secondary | ICD-10-CM

## 2016-01-16 LAB — URINALYSIS, COMPLETE
BILIRUBIN UA: NEGATIVE
Glucose, UA: NEGATIVE
KETONES UA: NEGATIVE
Nitrite, UA: NEGATIVE
SPEC GRAV UA: 1.02 (ref 1.005–1.030)
Urobilinogen, Ur: 0.2 mg/dL (ref 0.2–1.0)
pH, UA: 5.5 (ref 5.0–7.5)

## 2016-01-16 LAB — MICROSCOPIC EXAMINATION: RENAL EPITHEL UA: NONE SEEN /HPF

## 2016-01-16 MED ORDER — TRAZODONE HCL 50 MG PO TABS: 50.0000 mg | ORAL_TABLET | Freq: Every evening | ORAL | 5 refills | Status: DC | PRN

## 2016-01-16 MED ORDER — LINACLOTIDE 72 MCG PO CAPS: 72.0000 ug | ORAL_CAPSULE | Freq: Every day | ORAL | 11 refills | Status: DC

## 2016-01-16 MED ORDER — SULFAMETHOXAZOLE-TRIMETHOPRIM 800-160 MG PO TABS: 1.0000 | ORAL_TABLET | Freq: Two times a day (BID) | ORAL | 0 refills | Status: DC

## 2016-01-16 NOTE — Progress Notes (Signed)
BP 128/83   Pulse 82   Temp 97.5 F (36.4 C) (Oral)   Ht 5' 4"  (1.626 m)   Wt 211 lb 3.2 oz (95.8 kg)   BMI 36.25 kg/m    Subjective:    Patient ID: Judy Olson, female    DOB: Feb 16, 1975, 40 y.o.   MRN: [id number]  HPI: Judy Olson is a 40 y.o. female presenting on 01/16/2016 for Abdominal Pain; Urinary Frequency; Dysuria; Constipation; and Labs Only  Patient had not had her last performed. We will set this up. She is has significant amount of fatigue and hair loss. She does need labs performed today. At this point she is also having urinary tract infection with burning, frequency, abdominal pain. She denies any fever or chills. She also is experiencing significant insomnia. She has had that for some time. She has tried Ambien in the past. And Depakote was tried. She felt very sleepy on the Depakote. She is also expressed the death of both of her parents this year. She states that of course she is very overwhelmed with emotion. And finally she has had long-term constipation. She stated that when status at the high-dose caused severe cramping and diarrhea. She states that it didn't work. She has not taken the new or lowest dose with this. We have discussed trying this. 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  Past Medical History:  Diagnosis Date  . Anxiety   . Bronchitis    7 weeks ago requiring antibiotics amd steroids  . Constipation   . Generalized headaches   . Hiatal hernia    states was diagnosed before the gallbladder diagnosis   Relevant past medical, surgical, family and social history reviewed and updated as indicated. Interim medical history since our last visit reviewed. Allergies and medications reviewed and updated. DATA REVIEWED: CHART IN EPIC  Social History   Social History  . Marital status: Married    Spouse name: N/A  . Number of children: N/A  . Years of education: N/A   Occupational  History  . Not on file.   Social History Main Topics  . Smoking status: Never Smoker  . Smokeless tobacco: Never Used  . Alcohol use No  . Drug use: No  . Sexual activity: Not on file   Other Topics Concern  . Not on file   Social History Narrative  . No narrative on file    Past Surgical History:  Procedure Laterality Date  . CHOLECYSTECTOMY  12/23/2010   Procedure: LAPAROSCOPIC CHOLECYSTECTOMY WITH INTRAOPERATIVE CHOLANGIOGRAM;  Surgeon: Willey Blade, MD;  Location: [facility];  Service: General;  Laterality: N/A;  LAP CHOLE WITH X-RAY  . WISDOM TOOTH EXTRACTION     in oral surgeons office    Family History  Problem Relation Age of Onset  . Cancer Mother     ovarian  . Mitral valve prolapse Mother   . Cancer Maternal Grandmother     pancreatic, lymphoma    Review of Systems  Constitutional: Positive for fatigue. Negative for fever.  HENT: Negative.   Eyes: Negative.   Respiratory: Negative.   Gastrointestinal: Positive for abdominal pain and constipation.  Genitourinary: Positive for difficulty urinating, dysuria and urgency. Negative for flank pain.  Psychiatric/Behavioral: Positive for dysphoric mood and sleep disturbance. Negative for agitation and suicidal ideas. The patient is not nervous/anxious.     Allergies as of 01/16/2016  Reactions   Hydrocodone Shortness Of Breath   Banana Other (See Comments)   Chest tightness      Medication List       Accurate as of 01/16/16  9:20 AM. Always use your most recent med list.          BIOTIN PO Take 1,000 mg by mouth.   cholecalciferol 1000 units tablet Commonly known as:  VITAMIN D Take 1,000 Units by mouth daily.   finasteride 5 MG tablet Commonly known as:  PROSCAR Take 0.5 tablets (2.5 mg total) by mouth daily.   GIANVI 3-0.02 MG tablet Generic drug:  drospirenone-ethinyl estradiol   linaclotide 72 MCG capsule Commonly known as:  LINZESS Take 1 capsule (72 mcg total) by mouth daily  before breakfast.   loratadine 10 MG tablet Commonly known as:  CLARITIN Take 10 mg by mouth daily.   OVER THE COUNTER MEDICATION Magnesium and Co - Q- 10   STRESS FORMULA 500/BIOTIN PO Take 500 Units by mouth daily.   sulfamethoxazole-trimethoprim 800-160 MG tablet Commonly known as:  BACTRIM DS Take 1 tablet by mouth 2 (two) times daily.   traZODone 50 MG tablet Commonly known as:  DESYREL Take 1-2 tablets (50-100 mg total) by mouth at bedtime as needed for sleep.   tretinoin 0.025 % cream Commonly known as:  RETIN-A Apply to the face in the evening          Objective:    BP 128/83   Pulse 82   Temp 97.5 F (36.4 C) (Oral)   Ht 5' 4"  (1.626 m)   Wt 211 lb 3.2 oz (95.8 kg)   BMI 36.25 kg/m   Allergies  Allergen Reactions  . Hydrocodone Shortness Of Breath  . Banana Other (See Comments)    Chest tightness     Wt Readings from Last 3 Encounters:  01/16/16 211 lb 3.2 oz (95.8 kg)  10/31/15 206 lb 12.8 oz (93.8 kg)  10/29/15 207 lb (93.9 kg)    Physical Exam  Constitutional: She is oriented to person, place, and time. She appears well-developed and well-nourished.  HENT:  Head: Normocephalic and atraumatic.  Eyes: Conjunctivae are normal. Pupils are equal, round, and reactive to light.  Cardiovascular: Normal rate, regular rhythm, normal heart sounds and intact distal pulses.   Pulmonary/Chest: Effort normal and breath sounds normal.  Abdominal: Soft. Bowel sounds are normal. She exhibits no distension and no mass. There is tenderness in the suprapubic area. There is no rebound, no guarding and no CVA tenderness.  Neurological: She is alert and oriented to person, place, and time. She has normal reflexes.  Skin: Skin is warm and dry. No rash noted.  Psychiatric: She has a normal mood and affect. Her behavior is normal. Judgment and thought content normal.    Results for orders placed or performed in visit on 10/31/15  Hemoglobin A1C  Result Value Ref  Range   Hgb A1c MFr Bld 5.6 4.8 - 5.6 %   Est. average glucose Bld gHb Est-mCnc 114 mg/dL      Assessment & Plan:   1. Dysuria - Urinalysis, Complete - Urine culture  2. Acute cystitis with hematuria - sulfamethoxazole-trimethoprim (BACTRIM DS) 800-160 MG tablet; Take 1 tablet by mouth 2 (two) times daily.  Dispense: 14 tablet; Refill: 0  3. Slow transit constipation - linaclotide (LINZESS) 72 MCG capsule; Take 1 capsule (72 mcg total) by mouth daily before breakfast.  Dispense: 30 capsule; Refill: 11 - TSH  4. Insomnia, unspecified  type - traZODone (DESYREL) 50 MG tablet; Take 1-2 tablets (50-100 mg total) by mouth at bedtime as needed for sleep.  Dispense: 60 tablet; Refill: 5 - TSH  5. Well adult exam - TSH - CMP14+EGFR - Lipid panel This was not performed today but in place for her lab.  Continue all other maintenance medications as listed above.  Follow up plan: Return in about 4 weeks (around 02/13/2016) for recheck.  Orders Placed This Encounter  Procedures  . Urine culture  . Urinalysis, Complete  . TSH  . CMP14+EGFR  . Lipid panel    Educational handout given for UTI  Terald Sleeper PA-C [facility] [address]  [place], [place] [address] [phone]   01/16/2016, 9:20 AM

## 2016-01-16 NOTE — Patient Instructions (Signed)
Urinary Tract Infection, Adult Introduction A urinary tract infection (UTI) is an infection of any part of the urinary tract. The urinary tract includes the:  Kidneys.  Ureters.  Bladder.  Urethra. These organs make, store, and get rid of pee (urine) in the body. Follow these instructions at home:  Take over-the-counter and prescription medicines only as told by your doctor.  If you were prescribed an antibiotic medicine, take it as told by your doctor. Do not stop taking the antibiotic even if you start to feel better.  Avoid the following drinks:  Alcohol.  Caffeine.  Tea.  Carbonated drinks.  Drink enough fluid to keep your pee clear or pale yellow.  Keep all follow-up visits as told by your doctor. This is important.  Make sure to:  Empty your bladder often and completely. Do not to hold pee for long periods of time.  Empty your bladder before and after sex.  Wipe from front to back after a bowel movement if you are female. Use each tissue one time when you wipe. Contact a doctor if:  You have back pain.  You have a fever.  You feel sick to your stomach (nauseous).  You throw up (vomit).  Your symptoms do not get better after 3 days.  Your symptoms go away and then come back. Get help right away if:  You have very bad back pain.  You have very bad lower belly (abdominal) pain.  You are throwing up and cannot keep down any medicines or water. This information is not intended to replace advice given to you by your health care provider. Make sure you discuss any questions you have with your health care provider. Document Released: 07/07/2007 Document Revised: 06/26/2015 Document Reviewed: 12/09/2014  2017 Elsevier  

## 2016-01-17 LAB — CMP14+EGFR
A/G RATIO: 1.4 (ref 1.2–2.2)
ALK PHOS: 70 IU/L (ref 39–117)
ALT: 8 IU/L (ref 0–32)
AST: 11 IU/L (ref 0–40)
Albumin: 3.8 g/dL (ref 3.5–5.5)
BUN / CREAT RATIO: 23 (ref 9–23)
BUN: 17 mg/dL (ref 6–24)
Bilirubin Total: 0.3 mg/dL (ref 0.0–1.2)
CALCIUM: 9.1 mg/dL (ref 8.7–10.2)
CO2: 22 mmol/L (ref 18–29)
Chloride: 101 mmol/L (ref 96–106)
Creatinine, Ser: 0.74 mg/dL (ref 0.57–1.00)
GFR calc Af Amer: 116 mL/min/{1.73_m2} (ref >59)
GFR, EST NON AFRICAN AMERICAN: 101 mL/min/{1.73_m2} (ref >59)
GLOBULIN, TOTAL: 2.8 g/dL (ref 1.5–4.5)
Glucose: 88 mg/dL (ref 65–99)
POTASSIUM: 4.4 mmol/L (ref 3.5–5.2)
SODIUM: 139 mmol/L (ref 134–144)
Total Protein: 6.6 g/dL (ref 6.0–8.5)

## 2016-01-17 LAB — URINE CULTURE

## 2016-01-17 LAB — LIPID PANEL
CHOL/HDL RATIO: 3.7 ratio (ref 0.0–4.4)
Cholesterol, Total: 203 mg/dL — ABNORMAL HIGH (ref 100–199)
HDL: 55 mg/dL (ref >39)
LDL Calculated: 121 mg/dL — ABNORMAL HIGH (ref 0–99)
TRIGLYCERIDES: 136 mg/dL (ref 0–149)
VLDL Cholesterol Cal: 27 mg/dL (ref 5–40)

## 2016-01-17 LAB — TSH: TSH: 1.5 u[IU]/mL (ref 0.450–4.500)

## 2016-02-13 ENCOUNTER — Telehealth: Payer: Self-pay | Admitting: Physician Assistant

## 2016-02-13 DIAGNOSIS — N3001 Acute cystitis with hematuria: Secondary | ICD-10-CM

## 2016-02-13 MED ORDER — SULFAMETHOXAZOLE-TRIMETHOPRIM 800-160 MG PO TABS: 1.0000 | ORAL_TABLET | Freq: Two times a day (BID) | ORAL | 0 refills | Status: DC

## 2016-02-13 NOTE — Telephone Encounter (Signed)
Patient aware.

## 2016-02-13 NOTE — Telephone Encounter (Signed)
Prescription sent to pharmacy.

## 2016-02-16 ENCOUNTER — Ambulatory Visit: Payer: BC Managed Care – PPO | Admitting: Physician Assistant

## 2016-02-17 ENCOUNTER — Encounter: Payer: Self-pay | Admitting: Physician Assistant

## 2016-06-16 ENCOUNTER — Telehealth: Payer: Self-pay | Admitting: Physician Assistant

## 2016-06-19 ENCOUNTER — Ambulatory Visit (INDEPENDENT_AMBULATORY_CARE_PROVIDER_SITE_OTHER): Payer: BC Managed Care – PPO | Admitting: Family Medicine

## 2016-06-19 VITALS — BP 133/97 | HR 84 | Temp 98.3°F | Ht 64.0 in | Wt 220.0 lb

## 2016-06-19 DIAGNOSIS — J329 Chronic sinusitis, unspecified: Secondary | ICD-10-CM | POA: Diagnosis not present

## 2016-06-19 DIAGNOSIS — J301 Allergic rhinitis due to pollen: Secondary | ICD-10-CM

## 2016-06-19 DIAGNOSIS — J40 Bronchitis, not specified as acute or chronic: Secondary | ICD-10-CM | POA: Diagnosis not present

## 2016-06-19 MED ORDER — PSEUDOEPHEDRINE-GUAIFENESIN ER 120-1200 MG PO TB12: 1.0000 | ORAL_TABLET | Freq: Two times a day (BID) | ORAL | 0 refills | Status: DC

## 2016-06-19 MED ORDER — AMOXICILLIN-POT CLAVULANATE 875-125 MG PO TABS: 1.0000 | ORAL_TABLET | Freq: Two times a day (BID) | ORAL | 0 refills | Status: DC

## 2016-06-19 MED ORDER — BETAMETHASONE SOD PHOS & ACET 6 (3-3) MG/ML IJ SUSP
6.0000 mg | Freq: Once | INTRAMUSCULAR | Status: AC
  Administered 2016-06-19: 6 mg via INTRAMUSCULAR

## 2016-06-19 MED ORDER — FEXOFENADINE HCL 180 MG PO TABS: 180.0000 mg | ORAL_TABLET | Freq: Every day | ORAL | 11 refills | Status: DC

## 2016-06-19 NOTE — Progress Notes (Signed)
Subjective:  Patient ID: Judy Olson, female    DOB: 1975/08/26  Age: 41 y.o. MRN: [id number]  CC: Cough and Nasal Congestion   HPI Judy Olson presents for Patient presents with upper respiratory congestion. Rhinorrhea scant, clear. There is moderate scratchy throat. Patient reports coughing frequently as well.  Copious purulent sputum noted. There is no fever, chills or sweats. The patient denies being short of breath. Onset was 3 days ago. Gradually worsening. Tried OTC zyrtec  without improvement.   History Judy Olson has a past medical history of Anxiety; Bronchitis; Constipation; Generalized headaches; and Hiatal hernia.   She has a past surgical history that includes Wisdom tooth extraction and Cholecystectomy (12/23/2010).   Her family history includes Cancer in her maternal grandmother and mother; Mitral valve prolapse in her mother.She reports that she has never smoked. She has never used smokeless tobacco. She reports that she does not drink alcohol or use drugs.  Current Outpatient Prescriptions on File Prior to Visit  Medication Sig Dispense Refill  . BIOTIN PO Take 1,000 mg by mouth.    . cholecalciferol (VITAMIN D) 1000 UNITS tablet Take 1,000 Units by mouth daily.    . finasteride (PROSCAR) 5 MG tablet Take 0.5 tablets (2.5 mg total) by mouth daily. 30 tablet 11  . GIANVI 3-0.02 MG tablet     . loratadine (CLARITIN) 10 MG tablet Take 10 mg by mouth daily.    . Multiple Vitamin (STRESS FORMULA 500/BIOTIN PO) Take 500 Units by mouth daily.    . traZODone (DESYREL) 50 MG tablet Take 1-2 tablets (50-100 mg total) by mouth at bedtime as needed for sleep. 60 tablet 5  . tretinoin (RETIN-A) 0.025 % cream Apply to the face in the evening     No current facility-administered medications on file prior to visit.     ROS Review of Systems  Constitutional: Negative for activity change, appetite change, chills and fever.  HENT: Positive for congestion, postnasal drip, rhinorrhea,  sinus pressure and sore throat. Negative for ear discharge, ear pain, hearing loss, nosebleeds, sneezing and trouble swallowing.   Respiratory: Positive for cough. Negative for chest tightness and shortness of breath.   Cardiovascular: Negative for chest pain and palpitations.  Skin: Negative for rash.    Objective:  BP (!) 133/97   Pulse 84   Temp 98.3 F (36.8 C) (Oral)   Ht 5\' 4"  (1.626 m)   Wt 220 lb (99.8 kg)   BMI 37.76 kg/m   Physical Exam  Constitutional: She is oriented to person, place, and time. She appears well-developed and well-nourished.  HENT:  Head: Normocephalic and atraumatic.  Right Ear: Tympanic membrane and external ear normal. No decreased hearing is noted.  Left Ear: Tympanic membrane and external ear normal. No decreased hearing is noted.  Nose: Mucosal edema present. Right sinus exhibits no frontal sinus tenderness. Left sinus exhibits no frontal sinus tenderness.  Mouth/Throat: No oropharyngeal exudate or posterior oropharyngeal erythema.  Neck: No Brudzinski's sign noted.  Pulmonary/Chest: No respiratory distress. She has wheezes (bronchoalveolar change).  Lymphadenopathy:       Head (right side): No preauricular adenopathy present.       Head (left side): No preauricular adenopathy present.       Right cervical: No superficial cervical adenopathy present.      Left cervical: No superficial cervical adenopathy present.  Neurological: She is alert and oriented to person, place, and time.  Skin: Skin is warm and dry.  Psychiatric: She has a normal  mood and affect.    Assessment & Plan:   Judy Olson was seen today for cough and nasal congestion.  Diagnoses and all orders for this visit:  Sinobronchitis -     betamethasone acetate-betamethasone sodium phosphate (CELESTONE) injection 6 mg; Inject 1 mL (6 mg total) into the muscle once.  Seasonal allergic rhinitis due to pollen -     betamethasone acetate-betamethasone sodium phosphate (CELESTONE)  injection 6 mg; Inject 1 mL (6 mg total) into the muscle once.  Other orders -     amoxicillin-clavulanate (AUGMENTIN) 875-125 MG tablet; Take 1 tablet by mouth 2 (two) times daily. Take all of this medication -     Pseudoephedrine-Guaifenesin [phone] MG TB12; Take 1 tablet by mouth 2 (two) times daily. For congestion -     fexofenadine (ALLEGRA) 180 MG tablet; Take 1 tablet (180 mg total) by mouth daily. For allergy symptoms   I have discontinued Judy Olson's OVER THE COUNTER MEDICATION, linaclotide, and sulfamethoxazole-trimethoprim. I am also having her start on amoxicillin-clavulanate, Pseudoephedrine-Guaifenesin, and fexofenadine. Additionally, I am having her maintain her BIOTIN PO, cholecalciferol, GIANVI, tretinoin, Multiple Vitamin (STRESS FORMULA 500/BIOTIN PO), loratadine, finasteride, and traZODone. We will continue to administer betamethasone acetate-betamethasone sodium phosphate.  Meds ordered this encounter  Medications  . betamethasone acetate-betamethasone sodium phosphate (CELESTONE) injection 6 mg  . amoxicillin-clavulanate (AUGMENTIN) 875-125 MG tablet    Sig: Take 1 tablet by mouth 2 (two) times daily. Take all of this medication    Dispense:  20 tablet    Refill:  0  . Pseudoephedrine-Guaifenesin [phone] MG TB12    Sig: Take 1 tablet by mouth 2 (two) times daily. For congestion    Dispense:  20 each    Refill:  0  . fexofenadine (ALLEGRA) 180 MG tablet    Sig: Take 1 tablet (180 mg total) by mouth daily. For allergy symptoms    Dispense:  30 tablet    Refill:  11     Follow-up: Return if symptoms worsen or fail to improve.  Claretta Fraise, M.D.

## 2016-06-21 NOTE — Telephone Encounter (Signed)
Scheduled

## 2016-06-29 ENCOUNTER — Encounter: Payer: Self-pay | Admitting: Family

## 2016-06-29 ENCOUNTER — Ambulatory Visit (INDEPENDENT_AMBULATORY_CARE_PROVIDER_SITE_OTHER): Payer: BC Managed Care – PPO

## 2016-06-29 ENCOUNTER — Ambulatory Visit (INDEPENDENT_AMBULATORY_CARE_PROVIDER_SITE_OTHER): Payer: BC Managed Care – PPO | Admitting: Family

## 2016-06-29 VITALS — BP 124/78 | HR 90 | Temp 97.4°F | Ht 64.0 in | Wt 215.8 lb

## 2016-06-29 DIAGNOSIS — R05 Cough: Secondary | ICD-10-CM | POA: Diagnosis not present

## 2016-06-29 DIAGNOSIS — J209 Acute bronchitis, unspecified: Secondary | ICD-10-CM

## 2016-06-29 DIAGNOSIS — R059 Cough, unspecified: Secondary | ICD-10-CM

## 2016-06-29 MED ORDER — PREDNISONE 10 MG (21) PO TBPK: ORAL_TABLET | ORAL | 0 refills | Status: DC

## 2016-06-29 MED ORDER — DOXYCYCLINE HYCLATE 100 MG PO TABS: 100.0000 mg | ORAL_TABLET | Freq: Two times a day (BID) | ORAL | 0 refills | Status: DC

## 2016-06-29 MED ORDER — BENZONATATE 200 MG PO CAPS: 200.0000 mg | ORAL_CAPSULE | Freq: Three times a day (TID) | ORAL | 1 refills | Status: DC | PRN

## 2016-06-29 NOTE — Patient Instructions (Signed)

## 2016-06-29 NOTE — Progress Notes (Signed)
Subjective:    Patient ID: Judy Olson, female    DOB: 1975-08-24, 41 y.o.   MRN: [id number]  PT presents to the office today for recurrent cough/sinusisitis. Pt was seen in the office on 06/19/17 and was given celestone injections, Augmentin for 10 days, allegra, and mucinex with no relief.  Cough  This is a recurrent problem. The current episode started 1 to 4 weeks ago. The problem has been gradually worsening. The problem occurs every few minutes. The cough is non-productive. Associated symptoms include chills, headaches, myalgias, nasal congestion, postnasal drip, shortness of breath and wheezing. Pertinent negatives include no ear congestion, ear pain, fever, rhinorrhea or sore throat.      Review of Systems  Constitutional: Positive for chills. Negative for fever.  HENT: Positive for postnasal drip. Negative for ear pain, rhinorrhea and sore throat.   Respiratory: Positive for cough, shortness of breath and wheezing.   Musculoskeletal: Positive for myalgias.  Neurological: Positive for headaches.  All other systems reviewed and are negative.      Objective:   Physical Exam  Constitutional: She is oriented to person, place, and time. She appears well-developed and well-nourished. No distress.  HENT:  Head: Normocephalic and atraumatic.  Right Ear: External ear normal.  Left Ear: External ear normal.  Nose: Nose normal.  Mouth/Throat: Oropharynx is clear and moist.  Eyes: Pupils are equal, round, and reactive to light.  Neck: Normal range of motion. Neck supple. No thyromegaly present.  Cardiovascular: Normal rate, regular rhythm, normal heart sounds and intact distal pulses.   No murmur heard. Pulmonary/Chest: Effort normal and breath sounds normal. No respiratory distress. She has no wheezes.  Constant nonproductive cough   Abdominal: Soft. Bowel sounds are normal. She exhibits no distension. There is no tenderness.  Musculoskeletal: Normal range of motion. She  exhibits no edema or tenderness.  Neurological: She is alert and oriented to person, place, and time.  Skin: Skin is warm and dry.  Psychiatric: She has a normal mood and affect. Her behavior is normal. Judgment and thought content normal.  Vitals reviewed.     BP 124/78   Pulse 90   Temp 97.4 F (36.3 C) (Oral)   Ht 5\' 4"  (1.626 m)   Wt 215 lb 12.8 oz (97.9 kg)   SpO2 100%   BMI 37.04 kg/m      Assessment & Plan:  1. Cough - DG Chest 2 View; Future  2. Acute bronchitis, unspecified organism - Take meds as prescribed - Use a cool mist humidifier  -Use saline nose sprays frequently -Saline irrigations of the nose can be very helpful if done frequently.  * 4X daily for 1 week*  * Use of a nettie pot can be helpful with this. Follow directions with this* -Force fluids -For any cough or congestion  Use plain Mucinex- regular strength or max strength is fine   * Children- consult with Pharmacist for dosing -For fever or aces or pains- take tylenol or ibuprofen appropriate for age and weight.  * for fevers greater than 101 orally you may alternate ibuprofen and tylenol every  3 hours. -Throat lozenges if help - benzonatate (TESSALON) 200 MG capsule; Take 1 capsule (200 mg total) by mouth 3 (three) times daily as needed.  Dispense: 30 capsule; Refill: 1 - doxycycline (VIBRA-TABS) 100 MG tablet; Take 1 tablet (100 mg total) by mouth 2 (two) times daily.  Dispense: 20 tablet; Refill: 0 - predniSONE (STERAPRED UNI-PAK 21 TAB) 10 MG (21) TBPK  tablet; Use as directed  Dispense: 21 tablet; Refill: 0   Evelina Dun, FNP

## 2016-07-09 ENCOUNTER — Ambulatory Visit (INDEPENDENT_AMBULATORY_CARE_PROVIDER_SITE_OTHER): Payer: BC Managed Care – PPO | Admitting: Physician Assistant

## 2016-07-09 ENCOUNTER — Ambulatory Visit (INDEPENDENT_AMBULATORY_CARE_PROVIDER_SITE_OTHER): Payer: BC Managed Care – PPO

## 2016-07-09 ENCOUNTER — Encounter: Payer: Self-pay | Admitting: Physician Assistant

## 2016-07-09 ENCOUNTER — Other Ambulatory Visit: Payer: Self-pay | Admitting: Physician Assistant

## 2016-07-09 VITALS — BP 143/95 | HR 74 | Temp 98.5°F | Ht 64.0 in | Wt 218.0 lb

## 2016-07-09 DIAGNOSIS — M79605 Pain in left leg: Secondary | ICD-10-CM

## 2016-07-09 DIAGNOSIS — G8929 Other chronic pain: Secondary | ICD-10-CM

## 2016-07-09 DIAGNOSIS — M5440 Lumbago with sciatica, unspecified side: Secondary | ICD-10-CM

## 2016-07-09 MED ORDER — CYCLOBENZAPRINE HCL 10 MG PO TABS: 10.0000 mg | ORAL_TABLET | Freq: Three times a day (TID) | ORAL | 0 refills | Status: DC | PRN

## 2016-07-09 MED ORDER — MELOXICAM 15 MG PO TABS: 15.0000 mg | ORAL_TABLET | Freq: Every day | ORAL | 0 refills | Status: DC

## 2016-07-09 NOTE — Patient Instructions (Signed)

## 2016-07-09 NOTE — Progress Notes (Signed)
Subjective:     Patient ID: Judy Olson, female   DOB: December 04, 1975, 41 y.o.   MRN: [id number]  HPI Pt here with multiple complaints She has had chronic L LBP with L radicular leg pain She also admits to numbness in the L foot She denies any noted weakness in the leg or sx to cause fall More recent though she has had some lateral L hip pain No injury to either area She has tried OTC NSAIDS with no change in sx Warm compresses have helped some Pt recently treated for bronchitis with a Prednisone pack and this did not help sx  Review of Systems  Constitutional: Positive for activity change. Negative for fatigue and unexpected weight change.  Musculoskeletal: Positive for arthralgias, back pain, gait problem and myalgias. Negative for joint swelling.  Skin: Negative.        Objective:   Physical Exam  Constitutional: She appears well-developed and well-nourished. She appears distressed.  Nursing note and vitals reviewed. ++ TTP across the L L-spine area + TTP over the L SI joint FROM but causes sx SLR neg Muscle strength distal good Pulses/sensory good distal DTR 2+/= lower ext + TTP over the L lateral hip No sx with int/ext rotation Increase in sx with hip flexion No sx with lateral abduction Multiple spider varicosities noted to the upper leg L-spine and knee films- see Xray     Assessment:     Chronic left-sided low back pain with sciatica, sciatica laterality unspecified - Plan: DG Lumbar Spine 2-3 Views  Left leg pain - Plan: DG Knee Complete 4 Views Left      Plan:     Discussed sx should of improved with the Pred taper she recently was on Continue with heat/ice Gentle stretching Xray showed some straightening/spasm so trial of Flexeril- SE reviewed Mobic rx F/U if sx continue

## 2016-07-12 ENCOUNTER — Encounter: Payer: Self-pay | Admitting: Family Medicine

## 2016-07-12 ENCOUNTER — Telehealth: Payer: Self-pay | Admitting: Physician Assistant

## 2016-07-12 LAB — HM MAMMOGRAPHY

## 2016-07-13 NOTE — Telephone Encounter (Signed)
Called pt and let her know her images were ready to be picked up.

## 2016-08-11 ENCOUNTER — Encounter: Payer: Self-pay | Admitting: Physician Assistant

## 2016-09-06 ENCOUNTER — Encounter: Payer: Self-pay | Admitting: Physician Assistant

## 2016-09-06 MED ORDER — CITALOPRAM HYDROBROMIDE 20 MG PO TABS: 20.0000 mg | ORAL_TABLET | Freq: Every day | ORAL | 1 refills | Status: DC

## 2016-10-25 ENCOUNTER — Other Ambulatory Visit: Payer: Self-pay | Admitting: Chiropractic Medicine

## 2016-10-25 DIAGNOSIS — M545 Low back pain: Secondary | ICD-10-CM

## 2016-10-26 ENCOUNTER — Encounter: Payer: Self-pay | Admitting: Physician Assistant

## 2016-11-06 ENCOUNTER — Ambulatory Visit
Admission: RE | Admit: 2016-11-06 | Discharge: 2016-11-06 | Disposition: A | Discharge status: Home / Self Care | Payer: BC Managed Care – PPO | Source: Ambulatory Visit | Attending: Chiropractic Medicine | Admitting: Chiropractic Medicine

## 2016-11-06 DIAGNOSIS — M545 Low back pain: Secondary | ICD-10-CM

## 2016-11-16 ENCOUNTER — Other Ambulatory Visit: Payer: Self-pay | Admitting: Physician Assistant

## 2016-11-17 NOTE — Telephone Encounter (Signed)
OV 11/23/16 

## 2016-11-20 ENCOUNTER — Other Ambulatory Visit: Payer: Self-pay | Admitting: Physician Assistant

## 2016-11-23 ENCOUNTER — Encounter: Payer: Self-pay | Admitting: Physician Assistant

## 2016-11-23 ENCOUNTER — Ambulatory Visit (INDEPENDENT_AMBULATORY_CARE_PROVIDER_SITE_OTHER): Payer: BC Managed Care – PPO | Admitting: Physician Assistant

## 2016-11-23 VITALS — BP 120/84 | HR 84 | Temp 98.7°F | Ht 64.0 in | Wt 213.2 lb

## 2016-11-23 DIAGNOSIS — F339 Major depressive disorder, recurrent, unspecified: Secondary | ICD-10-CM

## 2016-11-23 DIAGNOSIS — J209 Acute bronchitis, unspecified: Secondary | ICD-10-CM

## 2016-11-23 DIAGNOSIS — M5126 Other intervertebral disc displacement, lumbar region: Secondary | ICD-10-CM | POA: Diagnosis not present

## 2016-11-23 DIAGNOSIS — M5416 Radiculopathy, lumbar region: Secondary | ICD-10-CM | POA: Diagnosis not present

## 2016-11-23 DIAGNOSIS — J321 Chronic frontal sinusitis: Secondary | ICD-10-CM | POA: Insufficient documentation

## 2016-11-23 MED ORDER — ALBUTEROL SULFATE HFA 108 (90 BASE) MCG/ACT IN AERS: 2.0000 | INHALATION_SPRAY | Freq: Four times a day (QID) | RESPIRATORY_TRACT | 0 refills | Status: DC | PRN

## 2016-11-23 MED ORDER — CEFDINIR 300 MG PO CAPS: 300.0000 mg | ORAL_CAPSULE | Freq: Two times a day (BID) | ORAL | 1 refills | Status: DC

## 2016-11-23 MED ORDER — CITALOPRAM HYDROBROMIDE 20 MG PO TABS: 20.0000 mg | ORAL_TABLET | Freq: Every day | ORAL | 3 refills | Status: DC

## 2016-11-23 MED ORDER — METHYLPREDNISOLONE ACETATE 80 MG/ML IJ SUSP
80.0000 mg | Freq: Once | INTRAMUSCULAR | Status: AC
  Administered 2016-11-23: 80 mg via INTRAMUSCULAR

## 2016-11-23 NOTE — Patient Instructions (Signed)
In a few days you may receive a survey in the mail or online from [employer] regarding your visit with us today. Please take a moment to fill this out. Your feedback is very important to our whole office. It can help us better understand your needs as well as improve your experience and satisfaction. Thank you for taking your time to complete it. We care about you.  Sharolyn Madeliene Tejera, PA-C  

## 2016-11-23 NOTE — Progress Notes (Signed)
BP 120/84   Pulse 84   Temp 98.7 F (37.1 C) (Oral)   Ht 5\' 4"  (1.626 m)   Wt 213 lb 3.2 oz (96.7 kg)   BMI 36.60 kg/m    Subjective:    Patient ID: Judy Olson, female    DOB: 28-Jan-1976, 41 y.o.   MRN: [id number]  HPI: Judy Olson is a 41 y.o. female presenting on 11/23/2016 for Follow-up and Bronchitis  Patient comes in for recheck on her depression medication.  She is doing very well with the Celexa.  She can tell a great difference in her general overall mood and feelings.  She had a very severe year with a lot of stressors.  She is also experiencing some sinus issues and a harsh cough.  She has a history of bronchitis and sinusitis in the past.  She is having nighttime coughing.  She also has wheezing after she coughs for a long time.  There is production of the cough with green and yellow color.  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  It was seen on MRI.  She will be going for an appointment with a neurosurgeon in a couple of weeks.  They will probably be discussion of surgical repair.  She is to continue with the left leg not that is been there for many years.  It was present before problems with her back came along.  We discussed that when she is out with surgery we can schedule an ultrasound of her left lateral leg to evaluate this mass.  Relevant past medical, surgical, family and social history reviewed and updated as indicated. Allergies and medications reviewed and updated.  Past Medical History:  Diagnosis Date  . Anxiety   . Bronchitis    7 weeks ago requiring antibiotics amd steroids  . Constipation   . Generalized headaches   . Hiatal hernia    states was diagnosed before the gallbladder diagnosis    Past Surgical History:  Procedure Laterality Date  . CHOLECYSTECTOMY  12/23/2010   Procedure: LAPAROSCOPIC CHOLECYSTECTOMY WITH INTRAOPERATIVE CHOLANGIOGRAM;  Surgeon: Willey Blade, MD;  Location: [facility];  Service: General;  Laterality:  N/A;  LAP CHOLE WITH X-RAY  . WISDOM TOOTH EXTRACTION     in oral surgeons office    Review of Systems  Constitutional: Positive for chills and fatigue. Negative for activity change and appetite change.  HENT: Positive for congestion, postnasal drip and sore throat.   Eyes: Negative.   Respiratory: Positive for cough and wheezing.   Cardiovascular: Negative.  Negative for chest pain, palpitations and leg swelling.  Gastrointestinal: Negative.   Genitourinary: Negative.   Musculoskeletal: Positive for arthralgias, back pain, gait problem and myalgias.  Skin: Negative.   Neurological: Positive for headaches.    Allergies as of 11/23/2016      Reactions   Hydrocodone Shortness Of Breath   Banana Other (See Comments)   Chest tightness      Medication List       Accurate as of 11/23/16 11:59 PM. Always use your most recent med list.          albuterol 108 (90 Base) MCG/ACT inhaler Commonly known as:  PROVENTIL HFA;VENTOLIN HFA Inhale 2 puffs into the lungs every 6 (six) hours as needed for wheezing or shortness of breath.   cefdinir 300 MG capsule Commonly known as:  OMNICEF Take 1 capsule (300 mg total) by mouth 2 (two) times daily. 1 po BID   citalopram  20 MG tablet Commonly known as:  CELEXA Take 1 tablet (20 mg total) by mouth daily.   finasteride 5 MG tablet Commonly known as:  PROSCAR TAKE ONE-HALF TABLET BY MOUTH ONCE DAILY   GIANVI 3-0.02 MG tablet Generic drug:  drospirenone-ethinyl estradiol   ibuprofen 800 MG tablet Commonly known as:  ADVIL,MOTRIN   Pseudoephedrine-Guaifenesin [phone] MG Tb12 Take 1 tablet by mouth 2 (two) times daily. For congestion   STRESS FORMULA 500/BIOTIN PO Take 500 Units by mouth daily.   traZODone 50 MG tablet Commonly known as:  DESYREL Take 1-2 tablets (50-100 mg total) by mouth at bedtime as needed for sleep.   tretinoin 0.025 % cream Commonly known as:  RETIN-A Apply to the face in the evening   XYZAL ALLERGY  24HR PO Take by mouth.          Objective:    BP 120/84   Pulse 84   Temp 98.7 F (37.1 C) (Oral)   Ht 5\' 4"  (1.626 m)   Wt 213 lb 3.2 oz (96.7 kg)   BMI 36.60 kg/m   Allergies  Allergen Reactions  . Hydrocodone Shortness Of Breath  . Banana Other (See Comments)    Chest tightness     Physical Exam  Constitutional: She is oriented to person, place, and time. She appears well-developed and well-nourished.  HENT:  Head: Normocephalic and atraumatic.  Right Ear: There is drainage and tenderness.  Left Ear: There is drainage and tenderness.  Nose: Mucosal edema and rhinorrhea present. Right sinus exhibits maxillary sinus tenderness and frontal sinus tenderness. Left sinus exhibits maxillary sinus tenderness and frontal sinus tenderness.  Mouth/Throat: Oropharyngeal exudate and posterior oropharyngeal erythema present.  Eyes: Pupils are equal, round, and reactive to light. Conjunctivae and EOM are normal.  Neck: Normal range of motion. Neck supple.  Cardiovascular: Normal rate, regular rhythm, normal heart sounds and intact distal pulses.   Pulmonary/Chest: Effort normal. She has wheezes in the right upper field and the left upper field.  Abdominal: Soft. Bowel sounds are normal.  Neurological: She is alert and oriented to person, place, and time. She has normal reflexes.  Skin: Skin is warm and dry. Bruising, lesion and rash noted. No ecchymosis noted. Rash is nodular. No erythema.     Greater than 3 cm diameter mass on left leg.  Mildly tender.  There is no skin changes associated, it is well-circumscribed  Psychiatric: She has a normal mood and affect. Her behavior is normal. Judgment and thought content normal.        Assessment & Plan:   1. Ruptured lumbar disc  2. Lumbar radiculopathy  3. Acute bronchitis, unspecified organism - cefdinir (OMNICEF) 300 MG capsule; Take 1 capsule (300 mg total) by mouth 2 (two) times daily. 1 po BID  Dispense: 20 capsule; Refill:  1 - albuterol (PROVENTIL HFA;VENTOLIN HFA) 108 (90 Base) MCG/ACT inhaler; Inhale 2 puffs into the lungs every 6 (six) hours as needed for wheezing or shortness of breath.  Dispense: 1 Inhaler; Refill: 0  4. Frontal sinusitis, unspecified chronicity - methylPREDNISolone acetate (DEPO-MEDROL) injection 80 mg; Inject 1 mL (80 mg total) into the muscle once.  5. Depression, recurrent ([facility]) - citalopram (CELEXA) 20 MG tablet; Take 1 tablet (20 mg total) by mouth daily.  Dispense: 90 tablet; Refill: 3    Current Outpatient Prescriptions:  .  citalopram (CELEXA) 20 MG tablet, Take 1 tablet (20 mg total) by mouth daily., Disp: 90 tablet, Rfl: 3 .  finasteride (PROSCAR)  5 MG tablet, TAKE ONE-HALF TABLET BY MOUTH ONCE DAILY, Disp: 45 tablet, Rfl: 0 .  GIANVI 3-0.02 MG tablet, , Disp: , Rfl:  .  ibuprofen (ADVIL,MOTRIN) 800 MG tablet, , Disp: , Rfl:  .  Levocetirizine Dihydrochloride (XYZAL ALLERGY 24HR PO), Take by mouth., Disp: , Rfl:  .  Multiple Vitamin (STRESS FORMULA 500/BIOTIN PO), Take 500 Units by mouth daily., Disp: , Rfl:  .  Pseudoephedrine-Guaifenesin [phone] MG TB12, Take 1 tablet by mouth 2 (two) times daily. For congestion, Disp: 20 each, Rfl: 0 .  traZODone (DESYREL) 50 MG tablet, Take 1-2 tablets (50-100 mg total) by mouth at bedtime as needed for sleep., Disp: 60 tablet, Rfl: 5 .  tretinoin (RETIN-A) 0.025 % cream, Apply to the face in the evening, Disp: , Rfl:  .  albuterol (PROVENTIL HFA;VENTOLIN HFA) 108 (90 Base) MCG/ACT inhaler, Inhale 2 puffs into the lungs every 6 (six) hours as needed for wheezing or shortness of breath., Disp: 1 Inhaler, Rfl: 0 .  cefdinir (OMNICEF) 300 MG capsule, Take 1 capsule (300 mg total) by mouth 2 (two) times daily. 1 po BID, Disp: 20 capsule, Rfl: 1 Continue all other maintenance medications as listed above.  Follow up plan: Return in about 6 months (around 05/24/2017) for recheck.  Educational handout given for [place]  PA-C [facility] [address]  [place], [place] [address] [phone]   11/24/2016, 9:16 AM

## 2016-11-24 DIAGNOSIS — F339 Major depressive disorder, recurrent, unspecified: Secondary | ICD-10-CM | POA: Insufficient documentation

## 2017-01-29 ENCOUNTER — Encounter: Payer: Self-pay | Admitting: Physician Assistant

## 2017-02-12 ENCOUNTER — Other Ambulatory Visit: Payer: Self-pay | Admitting: Physician Assistant

## 2017-05-09 ENCOUNTER — Other Ambulatory Visit: Payer: Self-pay | Admitting: Physician Assistant

## 2017-08-15 ENCOUNTER — Other Ambulatory Visit: Payer: Self-pay | Admitting: Physician Assistant

## 2017-10-19 ENCOUNTER — Ambulatory Visit: Payer: BC Managed Care – PPO | Admitting: Physician Assistant

## 2017-10-19 ENCOUNTER — Encounter: Payer: Self-pay | Admitting: Physician Assistant

## 2017-10-19 VITALS — BP 123/85 | HR 80 | Temp 98.5°F | Ht 64.0 in | Wt 232.8 lb

## 2017-10-19 DIAGNOSIS — L700 Acne vulgaris: Secondary | ICD-10-CM

## 2017-10-19 DIAGNOSIS — J209 Acute bronchitis, unspecified: Secondary | ICD-10-CM

## 2017-10-19 DIAGNOSIS — F339 Major depressive disorder, recurrent, unspecified: Secondary | ICD-10-CM | POA: Diagnosis not present

## 2017-10-19 DIAGNOSIS — L659 Nonscarring hair loss, unspecified: Secondary | ICD-10-CM

## 2017-10-19 MED ORDER — ALBUTEROL SULFATE HFA 108 (90 BASE) MCG/ACT IN AERS: 2.0000 | INHALATION_SPRAY | Freq: Four times a day (QID) | RESPIRATORY_TRACT | 0 refills | Status: DC | PRN

## 2017-10-19 MED ORDER — TOPIRAMATE 50 MG PO TABS: 50.0000 mg | ORAL_TABLET | Freq: Two times a day (BID) | ORAL | 1 refills | Status: DC

## 2017-10-19 MED ORDER — FINASTERIDE 5 MG PO TABS: ORAL_TABLET | ORAL | 11 refills | Status: DC

## 2017-10-19 MED ORDER — TRETINOIN 0.025 % EX CREA: TOPICAL_CREAM | CUTANEOUS | 11 refills | Status: DC

## 2017-10-19 MED ORDER — CITALOPRAM HYDROBROMIDE 20 MG PO TABS: 20.0000 mg | ORAL_TABLET | Freq: Every day | ORAL | 3 refills | Status: DC

## 2017-10-19 MED ORDER — AZITHROMYCIN 250 MG PO TABS: ORAL_TABLET | ORAL | 0 refills | Status: DC

## 2017-10-20 ENCOUNTER — Telehealth: Payer: Self-pay

## 2017-10-20 DIAGNOSIS — L659 Nonscarring hair loss, unspecified: Secondary | ICD-10-CM | POA: Insufficient documentation

## 2017-10-20 DIAGNOSIS — L700 Acne vulgaris: Secondary | ICD-10-CM | POA: Insufficient documentation

## 2017-10-20 NOTE — Progress Notes (Signed)
BP 123/85   Pulse 80   Temp 98.5 F (36.9 C) (Oral)   Ht 5\' 4"  (1.626 m)   Wt 232 lb 12.8 oz (105.6 kg)   BMI 39.96 kg/m    Subjective:    Patient ID: Judy Olson, female    DOB: 1976-01-20, 42 y.o.   MRN: [id number]  HPI: Judy Olson is a 42 y.o. female presenting on 10/19/2017 for Sinusitis and Medication Refill (Proscar)  Patient with several days of progressing upper respiratory and bronchial symptoms. Initially there was more upper respiratory congestion. This progressed to having significant cough that is productive throughout the day and severe at night. There is occasional wheezing after coughing. Sometimes there is slight dyspnea on exertion. It is productive mucus that is yellow in color. Denies any blood.  Also here for a recheck on her depression and anxiety. She reports being very stable and no complaints at this time  Patient also with alopecia and acne. She needs refills on her regularmedications.  Past Medical History:  Diagnosis Date  . Anxiety   . Bronchitis    7 weeks ago requiring antibiotics amd steroids  . Constipation   . Generalized headaches   . Hiatal hernia    states was diagnosed before the gallbladder diagnosis   Relevant past medical, surgical, family and social history reviewed and updated as indicated. Interim medical history since our last visit reviewed. Allergies and medications reviewed and updated. DATA REVIEWED: CHART IN EPIC  Family History reviewed for pertinent findings.  Review of Systems  Constitutional: Positive for chills and fatigue. Negative for activity change and appetite change.  HENT: Positive for congestion, postnasal drip and sore throat.   Eyes: Negative.   Respiratory: Positive for cough and wheezing.   Cardiovascular: Negative.  Negative for chest pain, palpitations and leg swelling.  Gastrointestinal: Negative.   Genitourinary: Negative.   Musculoskeletal: Negative.   Skin: Negative.   Neurological: Positive  for headaches.    Allergies as of 10/19/2017      Reactions   Hydrocodone Shortness Of Breath   Banana Other (See Comments)   Chest tightness      Medication List        Accurate as of 10/19/17 11:59 PM. Always use your most recent med list.          albuterol 108 (90 Base) MCG/ACT inhaler Commonly known as:  PROVENTIL HFA;VENTOLIN HFA Inhale 2 puffs into the lungs every 6 (six) hours as needed for wheezing or shortness of breath.   azithromycin 250 MG tablet Commonly known as:  ZITHROMAX Take as directed   citalopram 20 MG tablet Commonly known as:  CELEXA Take 1 tablet (20 mg total) by mouth daily.   finasteride 5 MG tablet Commonly known as:  PROSCAR TAKE 1/2 (ONE-HALF) TABLET BY MOUTH ONCE DAILY   GIANVI 3-0.02 MG tablet Generic drug:  drospirenone-ethinyl estradiol   ibuprofen 800 MG tablet Commonly known as:  ADVIL,MOTRIN   Pseudoephedrine-Guaifenesin [phone] MG Tb12 Take 1 tablet by mouth 2 (two) times daily. For congestion   STRESS FORMULA 500/BIOTIN PO Take 500 Units by mouth daily.   topiramate 50 MG tablet Commonly known as:  TOPAMAX Take 1-2 tablets (50-100 mg total) by mouth 2 (two) times daily.   traZODone 50 MG tablet Commonly known as:  DESYREL Take 1-2 tablets (50-100 mg total) by mouth at bedtime as needed for sleep.   tretinoin 0.025 % cream Commonly known as:  RETIN-A Apply to the face in the  evening   XYZAL ALLERGY 24HR PO Take by mouth.          Objective:    BP 123/85   Pulse 80   Temp 98.5 F (36.9 C) (Oral)   Ht 5\' 4"  (1.626 m)   Wt 232 lb 12.8 oz (105.6 kg)   BMI 39.96 kg/m   Allergies  Allergen Reactions  . Hydrocodone Shortness Of Breath  . Banana Other (See Comments)    Chest tightness     Wt Readings from Last 3 Encounters:  10/19/17 232 lb 12.8 oz (105.6 kg)  11/23/16 213 lb 3.2 oz (96.7 kg)  07/09/16 218 lb (98.9 kg)    Physical Exam  Constitutional: She is oriented to person, place, and time. She  appears well-developed and well-nourished.  HENT:  Head: Normocephalic and atraumatic.  Right Ear: There is drainage and tenderness.  Left Ear: There is drainage and tenderness.  Nose: Mucosal edema and rhinorrhea present. Right sinus exhibits no maxillary sinus tenderness and no frontal sinus tenderness. Left sinus exhibits no maxillary sinus tenderness and no frontal sinus tenderness.  Mouth/Throat: Oropharyngeal exudate and posterior oropharyngeal erythema present.  Eyes: Pupils are equal, round, and reactive to light. Conjunctivae and EOM are normal.  Neck: Normal range of motion. Neck supple.  Cardiovascular: Normal rate, regular rhythm, normal heart sounds and intact distal pulses.  Pulmonary/Chest: Effort normal. She has wheezes.  Abdominal: Soft. Bowel sounds are normal.  Neurological: She is alert and oriented to person, place, and time. She has normal reflexes.  Skin: Skin is warm and dry. No rash noted.  Psychiatric: She has a normal mood and affect. Her behavior is normal. Judgment and thought content normal.    Results for orders placed or performed in visit on 10/12/17  [facility] MAMMOGRAPHY  Result Value Ref Range   HM Mammogram 0-4 Bi-Rad 0-4 Bi-Rad, Self Reported Normal      Assessment & Plan:   1. Acute bronchitis, unspecified organism - azithromycin (ZITHROMAX Z-PAK) 250 MG tablet; Take as directed  Dispense: 6 each; Refill: 0 - albuterol (PROVENTIL HFA;VENTOLIN HFA) 108 (90 Base) MCG/ACT inhaler; Inhale 2 puffs into the lungs every 6 (six) hours as needed for wheezing or shortness of breath.  Dispense: 1 Inhaler; Refill: 0  2. Depression, recurrent ([place]) - citalopram (CELEXA) 20 MG tablet; Take 1 tablet (20 mg total) by mouth daily.  Dispense: 90 tablet; Refill: 3  3. Alopecia - finasteride (PROSCAR) 5 MG tablet; TAKE 1/2 (ONE-HALF) TABLET BY MOUTH ONCE DAILY  Dispense: 45 tablet; Refill: 11  4. Acne vulgaris - tretinoin (RETIN-A) 0.025 % cream; Apply to the face in  the evening  Dispense: 45 g; Refill: 11   Continue all other maintenance medications as listed above.  Follow up plan: Return in about 2 months (around 12/19/2017).  Educational handout given for [place] PA-C [facility] [address]  [place], [place] [address] [phone]   10/20/2017, 9:25 PM

## 2017-10-20 NOTE — Telephone Encounter (Signed)
Getting a prior auth for Tretinoin  Do not see a DX for this med  Please add to a note or problem list  Thanks

## 2017-10-21 NOTE — Telephone Encounter (Signed)
added

## 2017-10-25 ENCOUNTER — Encounter: Payer: Self-pay | Admitting: Physician Assistant

## 2017-10-27 ENCOUNTER — Other Ambulatory Visit: Payer: Self-pay | Admitting: Physician Assistant

## 2017-10-27 MED ORDER — CLINDAMYCIN PHOSPHATE 1 % EX SOLN: Freq: Two times a day (BID) | CUTANEOUS | 11 refills | Status: DC

## 2017-11-16 ENCOUNTER — Encounter (HOSPITAL_COMMUNITY): Payer: Self-pay | Admitting: Emergency Medicine

## 2017-11-16 ENCOUNTER — Other Ambulatory Visit: Payer: Self-pay

## 2017-11-16 ENCOUNTER — Emergency Department (HOSPITAL_COMMUNITY): Payer: BC Managed Care – PPO

## 2017-11-16 ENCOUNTER — Emergency Department (HOSPITAL_COMMUNITY)
Admission: EM | Admit: 2017-11-16 | Discharge: 2017-11-16 | Disposition: A | Discharge status: Home / Self Care | Payer: BC Managed Care – PPO | Attending: Emergency Medicine | Admitting: Emergency Medicine

## 2017-11-16 DIAGNOSIS — M549 Dorsalgia, unspecified: Secondary | ICD-10-CM | POA: Diagnosis present

## 2017-11-16 DIAGNOSIS — I10 Essential (primary) hypertension: Secondary | ICD-10-CM | POA: Diagnosis not present

## 2017-11-16 DIAGNOSIS — R091 Pleurisy: Secondary | ICD-10-CM | POA: Diagnosis not present

## 2017-11-16 DIAGNOSIS — Z79899 Other long term (current) drug therapy: Secondary | ICD-10-CM | POA: Diagnosis not present

## 2017-11-16 LAB — CBC WITH DIFFERENTIAL/PLATELET
Abs Immature Granulocytes: 0.03 10*3/uL (ref 0.00–0.07)
BASOS ABS: 0.1 10*3/uL (ref 0.0–0.1)
Basophils Relative: 0 %
EOS ABS: 0.1 10*3/uL (ref 0.0–0.5)
EOS PCT: 1 %
HCT: 39.1 % (ref 36.0–46.0)
HEMOGLOBIN: 12.7 g/dL (ref 12.0–15.0)
Immature Granulocytes: 0 %
LYMPHS PCT: 18 %
Lymphs Abs: 2.1 10*3/uL (ref 0.7–4.0)
MCH: 27.4 pg (ref 26.0–34.0)
MCHC: 32.5 g/dL (ref 30.0–36.0)
MCV: 84.3 fL (ref 80.0–100.0)
Monocytes Absolute: 0.8 10*3/uL (ref 0.1–1.0)
Monocytes Relative: 7 %
NRBC: 0 % (ref 0.0–0.2)
Neutro Abs: 8.7 10*3/uL — ABNORMAL HIGH (ref 1.7–7.7)
Neutrophils Relative %: 74 %
Platelets: 276 10*3/uL (ref 150–400)
RBC: 4.64 MIL/uL (ref 3.87–5.11)
RDW: 13 % (ref 11.5–15.5)
WBC: 11.8 10*3/uL — AB (ref 4.0–10.5)

## 2017-11-16 LAB — BASIC METABOLIC PANEL
Anion gap: 11 (ref 5–15)
BUN: 16 mg/dL (ref 6–20)
CALCIUM: 8.8 mg/dL — AB (ref 8.9–10.3)
CO2: 22 mmol/L (ref 22–32)
CREATININE: 0.72 mg/dL (ref 0.44–1.00)
Chloride: 104 mmol/L (ref 98–111)
Glucose, Bld: 122 mg/dL — ABNORMAL HIGH (ref 70–99)
Potassium: 3.3 mmol/L — ABNORMAL LOW (ref 3.5–5.1)
SODIUM: 137 mmol/L (ref 135–145)

## 2017-11-16 LAB — POCT I-STAT TROPONIN I: TROPONIN I, POC: 0 ng/mL (ref 0.00–0.08)

## 2017-11-16 LAB — D-DIMER, QUANTITATIVE (NOT AT ARMC): D DIMER QUANT: 0.27 ug{FEU}/mL (ref 0.00–0.50)

## 2017-11-16 MED ORDER — AMLODIPINE BESYLATE 5 MG PO TABS
5.0000 mg | ORAL_TABLET | Freq: Once | ORAL | Status: AC
  Administered 2017-11-16: 5 mg via ORAL
  Filled 2017-11-16: qty 1

## 2017-11-16 MED ORDER — KETOROLAC TROMETHAMINE 30 MG/ML IJ SOLN
30.0000 mg | Freq: Once | INTRAMUSCULAR | Status: AC
  Administered 2017-11-16: 30 mg via INTRAVENOUS
  Filled 2017-11-16: qty 1

## 2017-11-16 MED ORDER — NAPROXEN 500 MG PO TABS: 500.0000 mg | ORAL_TABLET | Freq: Two times a day (BID) | ORAL | 0 refills | Status: DC

## 2017-11-16 NOTE — Discharge Instructions (Signed)
Take the medications as needed for pain, follow-up with your primary care doctor to have your blood pressure rechecked

## 2017-11-16 NOTE — ED Provider Notes (Signed)
[facility] EMERGENCY DEPARTMENT Provider Note   CSN: [id number] Arrival date & time: 11/16/17  1920     History   Chief Complaint Chief Complaint  Patient presents with  . Back Pain    rib pain    HPI Judy Olson is a 42 y.o. female.  HPI Patient presents to the emergency room for evaluation of midthoracic back pain on the right side that shoots to the anterior aspect of her chest.  Patient states the symptoms started about 3 days ago.  Pain has been intermittent and sharp.  Today the symptoms have increased.  Pain increases with deep breathing and certain movements.  She denies any shortness of breath.  She denies any coughing.  She has not had any leg swelling.  Patient denies any history of PE or DVT.  She denies any history of heart disease.  She went to an urgent care today with her symptoms and they recommended she come to the emergency room for further evaluation. Past Medical History:  Diagnosis Date  . Anxiety   . Bronchitis    7 weeks ago requiring antibiotics amd steroids  . Constipation   . Generalized headaches   . Hiatal hernia    states was diagnosed before the gallbladder diagnosis    Patient Active Problem List   Diagnosis Date Noted  . Alopecia 10/20/2017  . Acne vulgaris 10/20/2017  . Depression, recurrent ([place]) 11/24/2016  . Ruptured lumbar disc 11/23/2016  . Lumbar radiculopathy 11/23/2016  . Acute bronchitis 11/23/2016  . Frontal sinusitis 11/23/2016  . Acute cystitis with hematuria 01/16/2016  . Slow transit constipation 01/16/2016  . Insomnia 01/16/2016  . Allergic rhinitis 10/28/2015  . PVC's (premature ventricular contractions) 01/18/2014    Past Surgical History:  Procedure Laterality Date  . CHOLECYSTECTOMY  12/23/2010   Procedure: LAPAROSCOPIC CHOLECYSTECTOMY WITH INTRAOPERATIVE CHOLANGIOGRAM;  Surgeon: Willey Blade, MD;  Location: [facility];  Service: General;  Laterality: N/A;  LAP CHOLE WITH X-RAY  . WISDOM TOOTH EXTRACTION      in oral surgeons office     OB History   None      Home Medications    Prior to Admission medications   Medication Sig Start Date End Date Taking? Authorizing Provider  citalopram (CELEXA) 20 MG tablet Take 1 tablet (20 mg total) by mouth daily. 10/19/17  Yes Terald Sleeper, PA-C  finasteride (PROSCAR) 5 MG tablet TAKE 1/2 (ONE-HALF) TABLET BY MOUTH ONCE DAILY 10/19/17  Yes Terald Sleeper, PA-C  GIANVI 3-0.02 MG tablet  12/08/13  Yes [provider]  Levocetirizine Dihydrochloride (XYZAL ALLERGY 24HR PO) Take by mouth.   Yes [provider]  Multiple Vitamin (STRESS FORMULA 500/BIOTIN PO) Take 500 Units by mouth daily.   Yes [provider]  tretinoin (RETIN-A) 0.025 % cream Apply to the face in the evening 10/19/17  Yes Jones, Londell Moh, PA-C  albuterol (PROVENTIL HFA;VENTOLIN HFA) 108 (90 Base) MCG/ACT inhaler Inhale 2 puffs into the lungs every 6 (six) hours as needed for wheezing or shortness of breath. 10/19/17   Terald Sleeper, PA-C  azithromycin (ZITHROMAX Z-PAK) 250 MG tablet Take as directed Patient not taking: Reported on 11/16/2017 10/19/17   Terald Sleeper, PA-C  clindamycin (CLEOCIN-T) 1 % external solution Apply topically 2 (two) times daily. Patient not taking: Reported on 11/16/2017 10/27/17   Terald Sleeper, PA-C  naproxen (NAPROSYN) 500 MG tablet Take 1 tablet (500 mg total) by mouth 2 (two) times daily. 11/16/17  Dorie Rank, MD  Pseudoephedrine-Guaifenesin [phone] MG TB12 Take 1 tablet by mouth 2 (two) times daily. For congestion 06/19/16   Claretta Fraise, MD  traZODone (DESYREL) 50 MG tablet Take 1-2 tablets (50-100 mg total) by mouth at bedtime as needed for sleep. 01/16/16   Terald Sleeper, PA-C    Family History Family History  Problem Relation Age of Onset  . Cancer Mother        ovarian  . Mitral valve prolapse Mother   . Cancer Maternal Grandmother        pancreatic, lymphoma    Social History Social History   Tobacco Use  .  Smoking status: Never Smoker  . Smokeless tobacco: Never Used  Substance Use Topics  . Alcohol use: No  . Drug use: No     Allergies   Hydrocodone and Banana   Review of Systems Review of Systems  All other systems reviewed and are negative.    Physical Exam Updated Vital Signs BP (!) 170/102   Pulse 72   Temp 98.1 F (36.7 C) (Oral)   Resp 16   LMP 11/02/2017   SpO2 100%   Physical Exam  Constitutional: She appears well-developed and well-nourished. No distress.  HENT:  Head: Normocephalic and atraumatic.  Right Ear: External ear normal.  Left Ear: External ear normal.  Eyes: Conjunctivae are normal. Right eye exhibits no discharge. Left eye exhibits no discharge. No scleral icterus.  Neck: Neck supple. No tracheal deviation present.  Cardiovascular: Normal rate, regular rhythm and intact distal pulses.  Pulmonary/Chest: Effort normal and breath sounds normal. No stridor. No respiratory distress. She has no wheezes. She has no rales.  Abdominal: Soft. Bowel sounds are normal. She exhibits no distension. There is no tenderness. There is no rebound and no guarding.  Musculoskeletal: She exhibits no edema or tenderness.  Neurological: She is alert. She has normal strength. No cranial nerve deficit (no facial droop, extraocular movements intact, no slurred speech) or sensory deficit. She exhibits normal muscle tone. She displays no seizure activity. Coordination normal.  Skin: Skin is warm and dry. No rash noted.  Psychiatric: She has a normal mood and affect.  Nursing note and vitals reviewed.    ED Treatments / Results  Labs (all labs ordered are listed, but only abnormal results are displayed) Labs Reviewed  CBC WITH DIFFERENTIAL/PLATELET - Abnormal; Notable for the following components:      Result Value   WBC 11.8 (*)    Neutro Abs 8.7 (*)    All other components within normal limits  BASIC METABOLIC PANEL - Abnormal; Notable for the following components:    Potassium 3.3 (*)    Glucose, Bld 122 (*)    Calcium 8.8 (*)    All other components within normal limits  D-DIMER, QUANTITATIVE (NOT AT [facility])  I-STAT TROPONIN, ED  POCT I-STAT TROPONIN I    EKG Sinus tachycardia rate 101 Occasional PVC Right axis deviation Poor R wave progression Normal ST T waves Prior EKG for comparison  Radiology Dg Chest 2 View  Result Date: 11/16/2017 CLINICAL DATA:  Back pain EXAM: CHEST - 2 VIEW COMPARISON:  06/29/2016 FINDINGS: The heart size and mediastinal contours are within normal limits. Both lungs are clear. The visualized skeletal structures are unremarkable. Surgical clips in the right upper quadrant. IMPRESSION: No active cardiopulmonary disease. Electronically Signed   By: Donavan Foil M.D.   On: 11/16/2017 20:01    Procedures Procedures (including critical care time)  Medications Ordered  in ED Medications  amLODipine (NORVASC) tablet 5 mg (has no administration in time range)  ketorolac (TORADOL) 30 MG/ML injection 30 mg (30 mg Intravenous Given 11/16/17 2056)     Initial Impression / Assessment and Plan / ED Course  I have reviewed the triage vital signs and the nursing notes.  Pertinent labs & imaging results that were available during my care of the patient were reviewed by me and considered in my medical decision making (see chart for details).  Clinical Course as of Nov 16 2198  Wed Nov 16, 2017  2143 Istat trop 0.00   [JK]    Clinical Course User Index [JK] Dorie Rank, MD    She presented to the emergency room for evaluation of sharp pleuritic chest pain.  No PE risk factors.  D-dimer is negative.  I doubt pulmonary embolism.  At times are atypical for acute coronary syndrome.  EKG is reassuring troponin is normal.  No widened mediastinum to suggest aortic dissection.  Suspect the patient may be having pleurisy when on discharge home with NSAIDs.  Pressure regimen.  I will give her a dose of Norvasc.  This could be  related to her pain.  I will have her follow-up with her primary care doctor to have that rechecked.  Final Clinical Impressions(s) / ED Diagnoses   Final diagnoses:  Pleurisy  Hypertension, unspecified type    ED Discharge Orders         Ordered    naproxen (NAPROSYN) 500 MG tablet  2 times daily     11/16/17 2159           Dorie Rank, MD 11/16/17 2201

## 2017-11-16 NOTE — ED Triage Notes (Signed)
Pt c/o right mid back pain that shoots through chest x 3 days. Seen urgent care today and had ekg and sent here. Pain worse with deep breath and certain movement. Denies cough. Lung sounds clear

## 2017-12-13 DIAGNOSIS — Z029 Encounter for administrative examinations, unspecified: Secondary | ICD-10-CM

## 2018-01-03 ENCOUNTER — Encounter: Payer: Self-pay | Admitting: Neurology

## 2018-01-04 ENCOUNTER — Other Ambulatory Visit: Payer: Self-pay | Admitting: *Deleted

## 2018-01-04 DIAGNOSIS — R52 Pain, unspecified: Secondary | ICD-10-CM

## 2018-01-24 ENCOUNTER — Encounter: Payer: BC Managed Care – PPO | Admitting: Neurology

## 2018-03-02 ENCOUNTER — Telehealth: Payer: Self-pay | Admitting: Physician Assistant

## 2018-04-15 ENCOUNTER — Other Ambulatory Visit: Payer: Self-pay | Admitting: Physician Assistant

## 2018-05-17 ENCOUNTER — Encounter: Payer: Self-pay | Admitting: Physician Assistant

## 2018-05-17 ENCOUNTER — Ambulatory Visit (INDEPENDENT_AMBULATORY_CARE_PROVIDER_SITE_OTHER): Payer: BC Managed Care – PPO | Admitting: Physician Assistant

## 2018-05-17 ENCOUNTER — Other Ambulatory Visit: Payer: Self-pay

## 2018-05-17 DIAGNOSIS — R635 Abnormal weight gain: Secondary | ICD-10-CM

## 2018-05-17 MED ORDER — TOPIRAMATE 50 MG PO TABS: 50.0000 mg | ORAL_TABLET | Freq: Two times a day (BID) | ORAL | 5 refills | Status: DC

## 2018-05-19 NOTE — Progress Notes (Signed)
Telephone visit  Subjective: HE:[id number] gain PCP: Terald Sleeper, PA-C EHU:[id number] Reddy is a 43 y.o. female calls for telephone consult today. Patient provides verbal consent for consult held via phone.  Patient is identified with 2 separate identifiers.  At this time the entire area is on COVID-19 social distancing and stay home orders are in place.  Patient is of higher risk and therefore we are performing this by a virtual method.  Location of patient: home Location of provider: [facility] Others present for call: no  This is a recheck for the patient's weight loss efforts. She started on Topamax a couple months ago.  She has built up to 100 mg a day.  She reports that it is helping greatly with her appetite.  She is tolerating it well.  She did have a little bit of taste changes.  However she mainly drinks water and no carbonated drinks to begin with.  She has been trying to walk a daily.  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  She reports that she would like to continue the medication.   ROS: Per HPI  Allergies  Allergen Reactions  . Hydrocodone Shortness Of Breath  . Banana Other (See Comments)    Chest tightness    Past Medical History:  Diagnosis Date  . Anxiety   . Bronchitis    7 weeks ago requiring antibiotics amd steroids  . Constipation   . Generalized headaches   . Hiatal hernia    states was diagnosed before the gallbladder diagnosis    Current Outpatient Medications:  .  albuterol (PROVENTIL HFA;VENTOLIN HFA) 108 (90 Base) MCG/ACT inhaler, Inhale 2 puffs into the lungs every 6 (six) hours as needed for wheezing or shortness of breath., Disp: 1 Inhaler, Rfl: 0 .  citalopram (CELEXA) 20 MG tablet, Take 1 tablet (20 mg total) by mouth daily., Disp: 90 tablet, Rfl: 3 .  clindamycin (CLEOCIN-T) 1 % external solution, Apply topically 2 (two) times daily., Disp: 60 mL, Rfl: 11 .  finasteride (PROSCAR) 5 MG tablet, TAKE 1/2 (ONE-HALF) TABLET BY MOUTH  ONCE DAILY, Disp: 45 tablet, Rfl: 11 .  GIANVI 3-0.02 MG tablet, , Disp: , Rfl:  .  Levocetirizine Dihydrochloride (XYZAL ALLERGY 24HR PO), Take by mouth., Disp: , Rfl:  .  Multiple Vitamin (STRESS FORMULA 500/BIOTIN PO), Take 500 Units by mouth daily., Disp: , Rfl:  .  naproxen (NAPROSYN) 500 MG tablet, Take 1 tablet (500 mg total) by mouth 2 (two) times daily., Disp: 30 tablet, Rfl: 0 .  topiramate (TOPAMAX) 50 MG tablet, Take 1-2 tablets (50-100 mg total) by mouth 2 (two) times daily., Disp: 120 tablet, Rfl: 5 .  traZODone (DESYREL) 50 MG tablet, Take 1-2 tablets (50-100 mg total) by mouth at bedtime as needed for sleep., Disp: 60 tablet, Rfl: 5 .  tretinoin (RETIN-A) 0.025 % cream, Apply to the face in the evening, Disp: 45 g, Rfl: 11  Assessment/ Plan: 43 y.o. female   1. Weight gain - topiramate (TOPAMAX) 50 MG tablet; Take 1-2 tablets (50-100 mg total) by mouth 2 (two) times daily.  Dispense: 120 tablet; Refill: 5   Start time: 12:05 PM End time: 12:14 PM  Meds ordered this encounter  Medications  . topiramate (TOPAMAX) 50 MG tablet    Sig: Take 1-2 tablets (50-100 mg total) by mouth 2 (two) times daily.    Dispense:  120 tablet    Refill:  5    Order Specific Question:  Supervising Provider    Answer:   Janora Norlander [[id number]]    Particia Nearing PA-C [place] [phone]

## 2018-05-20 ENCOUNTER — Encounter: Payer: Self-pay | Admitting: Physician Assistant

## 2018-05-22 ENCOUNTER — Encounter: Payer: Self-pay | Admitting: Physician Assistant

## 2018-06-20 ENCOUNTER — Encounter: Payer: Self-pay | Admitting: Physician Assistant

## 2018-06-20 ENCOUNTER — Other Ambulatory Visit: Payer: Self-pay | Admitting: Physician Assistant

## 2018-06-20 MED ORDER — DOXYCYCLINE 40 MG PO CPDR: 40.0000 mg | DELAYED_RELEASE_CAPSULE | Freq: Two times a day (BID) | ORAL | 11 refills | Status: DC

## 2018-06-20 MED ORDER — PHENTERMINE HCL 37.5 MG PO TABS: 37.5000 mg | ORAL_TABLET | Freq: Every day | ORAL | 1 refills | Status: DC

## 2018-06-23 ENCOUNTER — Encounter: Payer: Self-pay | Admitting: Physician Assistant

## 2018-07-03 ENCOUNTER — Encounter: Payer: Self-pay | Admitting: Physician Assistant

## 2018-08-18 ENCOUNTER — Other Ambulatory Visit: Payer: Self-pay

## 2018-08-21 ENCOUNTER — Other Ambulatory Visit: Payer: Self-pay

## 2018-08-21 ENCOUNTER — Ambulatory Visit (INDEPENDENT_AMBULATORY_CARE_PROVIDER_SITE_OTHER): Payer: BC Managed Care – PPO | Admitting: Physician Assistant

## 2018-08-21 ENCOUNTER — Encounter: Payer: Self-pay | Admitting: Physician Assistant

## 2018-08-21 DIAGNOSIS — F339 Major depressive disorder, recurrent, unspecified: Secondary | ICD-10-CM

## 2018-08-21 DIAGNOSIS — L659 Nonscarring hair loss, unspecified: Secondary | ICD-10-CM | POA: Diagnosis not present

## 2018-08-21 DIAGNOSIS — R635 Abnormal weight gain: Secondary | ICD-10-CM | POA: Diagnosis not present

## 2018-08-21 DIAGNOSIS — J209 Acute bronchitis, unspecified: Secondary | ICD-10-CM

## 2018-08-21 DIAGNOSIS — L700 Acne vulgaris: Secondary | ICD-10-CM

## 2018-08-21 MED ORDER — TRETINOIN 0.025 % EX CREA: TOPICAL_CREAM | CUTANEOUS | 11 refills | Status: DC

## 2018-08-21 MED ORDER — PHENTERMINE HCL 37.5 MG PO TABS: 37.5000 mg | ORAL_TABLET | Freq: Every day | ORAL | 2 refills | Status: DC

## 2018-08-21 MED ORDER — ALBUTEROL SULFATE HFA 108 (90 BASE) MCG/ACT IN AERS: 2.0000 | INHALATION_SPRAY | Freq: Four times a day (QID) | RESPIRATORY_TRACT | 0 refills | Status: DC | PRN

## 2018-08-21 MED ORDER — CLINDAMYCIN PHOSPHATE 1 % EX SOLN: Freq: Two times a day (BID) | CUTANEOUS | 11 refills | Status: DC

## 2018-08-21 MED ORDER — GABAPENTIN 100 MG PO CAPS: 100.0000 mg | ORAL_CAPSULE | Freq: Every day | ORAL | 5 refills | Status: DC

## 2018-08-21 MED ORDER — FINASTERIDE 5 MG PO TABS: ORAL_TABLET | ORAL | 11 refills | Status: DC

## 2018-08-21 MED ORDER — TOPIRAMATE 50 MG PO TABS: 50.0000 mg | ORAL_TABLET | Freq: Two times a day (BID) | ORAL | 5 refills | Status: DC

## 2018-08-21 MED ORDER — CITALOPRAM HYDROBROMIDE 20 MG PO TABS: 20.0000 mg | ORAL_TABLET | Freq: Every day | ORAL | 3 refills | Status: DC

## 2018-08-22 MED ORDER — ALBUTEROL SULFATE HFA 108 (90 BASE) MCG/ACT IN AERS: 2.0000 | INHALATION_SPRAY | Freq: Four times a day (QID) | RESPIRATORY_TRACT | 1 refills | Status: DC | PRN

## 2018-08-22 NOTE — Progress Notes (Signed)
BP 121/86   Pulse 80   Temp (!) 97.2 F (36.2 C) (Oral)   Ht 5\' 4"  (1.626 m)   Wt 228 lb 6.4 oz (103.6 kg)   BMI 39.20 kg/m    Subjective:    Patient ID: Judy Olson, female    DOB: [birth year], 43 y.o.   MRN: [id number]  HPI: Judy Olson is a 43 y.o. female presenting on 08/21/2018 for Weight Check   This patient comes in for recheck on her chronic medical conditions.  She has been trying to work on losing weight.  She has not been doing very well with all of the straining of COVID.  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  She is also can be in charge of arrival of all the students during the COVID restriction.  She also does need refills on her medications for her acne and alopecia.  She does not have any complaints.  She states that is doing fairly well.  She also needs a refill for her depression medication.  And finally she does need an inhaler in case she developed some bronchitis at another time. Past Medical History:  Diagnosis Date  . Anxiety   . Bronchitis    7 weeks ago requiring antibiotics amd steroids  . Constipation   . Generalized headaches   . Hiatal hernia    states was diagnosed before the gallbladder diagnosis   Relevant past medical, surgical, family and social history reviewed and updated as indicated. Interim medical history since our last visit reviewed. Allergies and medications reviewed and updated. DATA REVIEWED: CHART IN EPIC  Family History reviewed for pertinent findings.  Review of Systems  Constitutional: Positive for fatigue.  HENT: Negative.   Eyes: Negative.   Respiratory: Negative.   Gastrointestinal: Negative.   Genitourinary: Negative.   Musculoskeletal: Positive for arthralgias, back pain and myalgias.    Allergies as of 08/21/2018      Reactions   Hydrocodone Shortness Of Breath   Banana Other (See Comments)   Chest tightness      Medication List       Accurate as  of August 21, 2018 11:59 PM. If you have any questions, ask your nurse or doctor.        STOP taking these medications   Gianvi 3-0.02 MG tablet Generic drug: drospirenone-ethinyl estradiol Stopped by: Terald Sleeper, PA-C   naproxen 500 MG tablet Commonly known as: NAPROSYN Stopped by: Terald Sleeper, PA-C   traZODone 50 MG tablet Commonly known as: DESYREL Stopped by: Terald Sleeper, PA-C     TAKE these medications   albuterol 108 (90 Base) MCG/ACT inhaler Commonly known as: VENTOLIN HFA Inhale 2 puffs into the lungs every 6 (six) hours as needed for wheezing or shortness of breath.   citalopram 20 MG tablet Commonly known as: CELEXA Take 1 tablet (20 mg total) by mouth daily.   clindamycin 1 % external solution Commonly known as: Cleocin-T Apply topically 2 (two) times daily.   doxycycline 40 MG capsule Commonly known as: ORACEA Take 1 capsule (40 mg total) by mouth 2 (two) times a day.   finasteride 5 MG tablet Commonly known as: PROSCAR TAKE 1/2 (ONE-HALF) TABLET BY MOUTH ONCE DAILY   gabapentin 100 MG capsule Commonly known as: NEURONTIN Take 1-3 capsules (100-300 mg total) by mouth at bedtime. What changed:   medication strength  See the  new instructions. Changed by: Terald Sleeper, PA-C   phentermine 37.5 MG tablet Commonly known as: ADIPEX-P Take 1 tablet (37.5 mg total) by mouth daily before breakfast.   STRESS FORMULA 500/BIOTIN PO Take 500 Units by mouth daily.   topiramate 50 MG tablet Commonly known as: Topamax Take 1-2 tablets (50-100 mg total) by mouth 2 (two) times daily.   tretinoin 0.025 % cream Commonly known as: RETIN-A Apply to the face in the evening   XYZAL ALLERGY 24HR PO Take by mouth.          Objective:    BP 121/86   Pulse 80   Temp (!) 97.2 F (36.2 C) (Oral)   Ht 5\' 4"  (1.626 m)   Wt 228 lb 6.4 oz (103.6 kg)   BMI 39.20 kg/m   Allergies  Allergen Reactions  . Hydrocodone Shortness Of Breath  . Banana Other  (See Comments)    Chest tightness     Wt Readings from Last 3 Encounters:  08/21/18 228 lb 6.4 oz (103.6 kg)  10/19/17 232 lb 12.8 oz (105.6 kg)  11/23/16 213 lb 3.2 oz (96.7 kg)    Physical Exam Constitutional:      Appearance: She is well-developed.  HENT:     Head: Normocephalic and atraumatic.  Eyes:     Conjunctiva/sclera: Conjunctivae normal.     Pupils: Pupils are equal, round, and reactive to light.  Cardiovascular:     Rate and Rhythm: Normal rate and regular rhythm.     Heart sounds: Normal heart sounds.  Pulmonary:     Effort: Pulmonary effort is normal.     Breath sounds: Normal breath sounds.  Abdominal:     General: Bowel sounds are normal.     Palpations: Abdomen is soft.  Skin:    General: Skin is warm and dry.     Findings: No rash.  Neurological:     Mental Status: She is alert and oriented to person, place, and time.     Deep Tendon Reflexes: Reflexes are normal and symmetric.  Psychiatric:        Behavior: Behavior normal.        Thought Content: Thought content normal.        Judgment: Judgment normal.     Results for orders placed or performed during the hospital encounter of 11/16/17  CBC with Differential  Result Value Ref Range   WBC 11.8 (H) 4.0 - 10.5 K/uL   RBC 4.64 3.87 - 5.11 MIL/uL   Hemoglobin 12.7 12.0 - 15.0 g/dL   HCT 39.1 36.0 - 46.0 %   MCV 84.3 80.0 - 100.0 fL   MCH 27.4 26.0 - 34.0 pg   MCHC 32.5 30.0 - 36.0 g/dL   RDW 13.0 11.5 - 15.5 %   Platelets 276 150 - 400 K/uL   nRBC 0.0 0.0 - 0.2 %   Neutrophils Relative % 74 %   Neutro Abs 8.7 (H) 1.7 - 7.7 K/uL   Lymphocytes Relative 18 %   Lymphs Abs 2.1 0.7 - 4.0 K/uL   Monocytes Relative 7 %   Monocytes Absolute 0.8 0.1 - 1.0 K/uL   Eosinophils Relative 1 %   Eosinophils Absolute 0.1 0.0 - 0.5 K/uL   Basophils Relative 0 %   Basophils Absolute 0.1 0.0 - 0.1 K/uL   Immature Granulocytes 0 %   Abs Immature Granulocytes 0.03 0.00 - 0.07 K/uL  Basic metabolic panel   Result Value Ref Range   Sodium 137  135 - 145 mmol/L   Potassium 3.3 (L) 3.5 - 5.1 mmol/L   Chloride 104 98 - 111 mmol/L   CO2 22 22 - 32 mmol/L   Glucose, Bld 122 (H) 70 - 99 mg/dL   BUN 16 6 - 20 mg/dL   Creatinine, Ser 0.72 0.44 - 1.00 mg/dL   Calcium 8.8 (L) 8.9 - 10.3 mg/dL   GFR calc non Af Amer >60 >60 mL/min   GFR calc Af Amer >60 >60 mL/min   Anion gap 11 5 - 15  D-dimer, quantitative (not at [facility])  Result Value Ref Range   D-Dimer, Quant 0.27 0.00 - 0.50 ug/mL-FEU  POCT i-Stat troponin I  Result Value Ref Range   Troponin i, poc 0.00 0.00 - 0.08 ng/mL   Comment 3              Assessment & Plan:   1. Depression, recurrent ([place]) - citalopram (CELEXA) 20 MG tablet; Take 1 tablet (20 mg total) by mouth daily.  Dispense: 90 tablet; Refill: 3  2. Acute bronchitis, unspecified organism - albuterol (VENTOLIN HFA) 108 (90 Base) MCG/ACT inhaler; Inhale 2 puffs into the lungs every 6 (six) hours as needed for wheezing or shortness of breath.  Dispense: 18 g; Refill: 1  3. Alopecia - finasteride (PROSCAR) 5 MG tablet; TAKE 1/2 (ONE-HALF) TABLET BY MOUTH ONCE DAILY  Dispense: 45 tablet; Refill: 11  4. Weight gain - topiramate (TOPAMAX) 50 MG tablet; Take 1-2 tablets (50-100 mg total) by mouth 2 (two) times daily.  Dispense: 120 tablet; Refill: 5  5. Acne vulgaris - tretinoin (RETIN-A) 0.025 % cream; Apply to the face in the evening  Dispense: 45 g; Refill: 11   Continue all other maintenance medications as listed above.  Follow up plan: Return in about 6 months (around 02/21/2019) for recheck medications.  Educational handout given for [place] PA-C [place] [address]  [place], [place] [address] [phone]   08/22/2018, 9:41 PM

## 2018-09-23 ENCOUNTER — Encounter: Payer: Self-pay | Admitting: Physician Assistant

## 2018-09-25 ENCOUNTER — Other Ambulatory Visit: Payer: Self-pay | Admitting: Physician Assistant

## 2018-09-25 MED ORDER — DOXYCYCLINE HYCLATE 50 MG PO CAPS: 50.0000 mg | ORAL_CAPSULE | Freq: Two times a day (BID) | ORAL | 11 refills | Status: DC

## 2018-10-15 ENCOUNTER — Encounter: Payer: Self-pay | Admitting: Physician Assistant

## 2019-02-19 ENCOUNTER — Encounter: Payer: Self-pay | Admitting: Physician Assistant

## 2019-02-19 ENCOUNTER — Other Ambulatory Visit: Payer: Self-pay

## 2019-02-19 ENCOUNTER — Ambulatory Visit: Payer: BC Managed Care – PPO | Admitting: Physician Assistant

## 2019-02-19 VITALS — BP 131/95 | HR 82 | Temp 98.6°F | Resp 20 | Ht 64.0 in | Wt 219.0 lb

## 2019-02-19 DIAGNOSIS — R635 Abnormal weight gain: Secondary | ICD-10-CM

## 2019-02-19 DIAGNOSIS — M5416 Radiculopathy, lumbar region: Secondary | ICD-10-CM

## 2019-02-19 DIAGNOSIS — Z0000 Encounter for general adult medical examination without abnormal findings: Secondary | ICD-10-CM

## 2019-02-19 DIAGNOSIS — Z6837 Body mass index (BMI) 37.0-37.9, adult: Secondary | ICD-10-CM | POA: Diagnosis not present

## 2019-02-19 DIAGNOSIS — Z6836 Body mass index (BMI) 36.0-36.9, adult: Secondary | ICD-10-CM | POA: Insufficient documentation

## 2019-02-19 DIAGNOSIS — Z713 Dietary counseling and surveillance: Secondary | ICD-10-CM | POA: Diagnosis not present

## 2019-02-19 MED ORDER — GABAPENTIN 100 MG PO CAPS: 100.0000 mg | ORAL_CAPSULE | Freq: Every day | ORAL | 5 refills | Status: DC

## 2019-02-19 MED ORDER — PHENTERMINE HCL 37.5 MG PO TABS: 37.5000 mg | ORAL_TABLET | Freq: Every day | ORAL | 2 refills | Status: DC

## 2019-02-19 MED ORDER — TOPIRAMATE 50 MG PO TABS: 50.0000 mg | ORAL_TABLET | Freq: Two times a day (BID) | ORAL | 5 refills | Status: DC

## 2019-02-19 NOTE — Progress Notes (Signed)
Acute Office Visit  Subjective:    Patient ID: Judy Olson, female    DOB: 20-Nov-1975, 44 y.o.   MRN: [id number]  Chief Complaint  Patient presents with  . Medical Management of Chronic Issues    Back Pain This is a chronic problem. The current episode started more than 1 year ago. The problem occurs every several days. The problem is unchanged. The pain is present in the lumbar spine. Stiffness is present in the morning. She has tried analgesics for the symptoms.   She has been working hard on her weight loss efforts since the beginning of the year.  She has been watching what she is eating, and drinking water.  She is trying to start exercise back again.  She had been on the phentermine and Topamax last year but without much success.  However she is quite motivated to get started this year.  She has been down 9 pounds in the beginning of the year.  She does need refills on some of her medications that she uses for her neuropathy she does need gabapentin.  She normally takes 2 at bedtime at the most.  And 3 on the weekend if she is really hurting.  They still do make her a little bit drowsy.    Past Medical History:  Diagnosis Date  . Anxiety   . Bronchitis    7 weeks ago requiring antibiotics amd steroids  . Constipation   . Generalized headaches   . Hiatal hernia    states was diagnosed before the gallbladder diagnosis    Past Surgical History:  Procedure Laterality Date  . CHOLECYSTECTOMY  12/23/2010   Procedure: LAPAROSCOPIC CHOLECYSTECTOMY WITH INTRAOPERATIVE CHOLANGIOGRAM;  Surgeon: Willey Blade, MD;  Location: [facility];  Service: General;  Laterality: N/A;  LAP CHOLE WITH X-RAY  . WISDOM TOOTH EXTRACTION     in oral surgeons office    Family History  Problem Relation Age of Onset  . Cancer Mother        ovarian  . Mitral valve prolapse Mother   . Cancer Maternal Grandmother        pancreatic, lymphoma    Social History   Socioeconomic History  .  Marital status: Married    Spouse name: Not on file  . Number of children: Not on file  . Years of education: Not on file  . Highest education level: Not on file  Occupational History  . Not on file  Tobacco Use  . Smoking status: Never Smoker  . Smokeless tobacco: Never Used  Substance and Sexual Activity  . Alcohol use: No  . Drug use: No  . Sexual activity: Not on file  Other Topics Concern  . Not on file  Social History Narrative  . Not on file   Social Determinants of Health   Financial Resource Strain:   . Difficulty of Paying Living Expenses: Not on file  Food Insecurity:   . Worried About Charity fundraiser in the Last Year: Not on file  . Ran Out of Food in the Last Year: Not on file  Transportation Needs:   . Lack of Transportation (Medical): Not on file  . Lack of Transportation (Non-Medical): Not on file  Physical Activity:   . Days of Exercise per Week: Not on file  . Minutes of Exercise per Session: Not on file  Stress:   . Feeling of Stress : Not on file  Social Connections:   . Frequency of Communication with  Friends and Family: Not on file  . Frequency of Social Gatherings with Friends and Family: Not on file  . Attends Religious Services: Not on file  . Active Member of Clubs or Organizations: Not on file  . Attends Archivist Meetings: Not on file  . Marital Status: Not on file  Intimate Partner Violence:   . Fear of Current or Ex-Partner: Not on file  . Emotionally Abused: Not on file  . Physically Abused: Not on file  . Sexually Abused: Not on file    Outpatient Medications Prior to Visit  Medication Sig Dispense Refill  . albuterol (VENTOLIN HFA) 108 (90 Base) MCG/ACT inhaler Inhale 2 puffs into the lungs every 6 (six) hours as needed for wheezing or shortness of breath. 18 g 1  . citalopram (CELEXA) 20 MG tablet Take 1 tablet (20 mg total) by mouth daily. 90 tablet 3  . clindamycin (CLEOCIN-T) 1 % external solution Apply topically  2 (two) times daily. 60 mL 11  . doxycycline (VIBRAMYCIN) 50 MG capsule Take 1 capsule (50 mg total) by mouth 2 (two) times daily. 60 capsule 11  . finasteride (PROSCAR) 5 MG tablet TAKE 1/2 (ONE-HALF) TABLET BY MOUTH ONCE DAILY 45 tablet 11  . Levocetirizine Dihydrochloride (XYZAL ALLERGY 24HR PO) Take by mouth.    . tretinoin (RETIN-A) 0.025 % cream Apply to the face in the evening 45 g 11  . doxycycline (ORACEA) 40 MG capsule Take 1 capsule (40 mg total) by mouth 2 (two) times a day. 60 capsule 11  . gabapentin (NEURONTIN) 100 MG capsule Take 1-3 capsules (100-300 mg total) by mouth at bedtime. 90 capsule 5  . phentermine (ADIPEX-P) 37.5 MG tablet Take 1 tablet (37.5 mg total) by mouth daily before breakfast. 30 tablet 2  . topiramate (TOPAMAX) 50 MG tablet Take 1-2 tablets (50-100 mg total) by mouth 2 (two) times daily. 120 tablet 5  . Multiple Vitamin (STRESS FORMULA 500/BIOTIN PO) Take 500 Units by mouth daily.     No facility-administered medications prior to visit.    Allergies  Allergen Reactions  . Hydrocodone Shortness Of Breath  . Banana Other (See Comments)    Chest tightness     Review of Systems  Constitutional: Negative.   HENT: Negative.   Eyes: Negative.   Respiratory: Negative.   Gastrointestinal: Negative.   Genitourinary: Negative.   Musculoskeletal: Positive for arthralgias, back pain, gait problem and myalgias.       Objective:    Physical Exam Constitutional:      General: She is not in acute distress.    Appearance: Normal appearance. She is well-developed.  HENT:     Head: Normocephalic and atraumatic.  Cardiovascular:     Rate and Rhythm: Normal rate.  Pulmonary:     Effort: Pulmonary effort is normal.  Skin:    General: Skin is warm and dry.     Findings: No rash.  Neurological:     Mental Status: She is alert and oriented to person, place, and time.     Deep Tendon Reflexes: Reflexes are normal and symmetric.     BP (!) 131/95    Pulse 82   Temp 98.6 F (37 C) (Temporal)   Resp 20   Ht 5\' 4"  (1.626 m)   Wt 219 lb (99.3 kg)   SpO2 100%   BMI 37.59 kg/m  Wt Readings from Last 3 Encounters:  02/19/19 219 lb (99.3 kg)  08/21/18 228 lb 6.4 oz (103.6 kg)  10/19/17 232 lb 12.8 oz (105.6 kg)    Health Maintenance Due  Topic Date Due  . HIV Screening  02/28/1989  . TETANUS/TDAP  10/29/2001  . PAP SMEAR-Modifier  06/25/2017  . MAMMOGRAM  07/12/2017    There are no preventive care reminders to display for this patient.   Lab Results  Component Value Date   TSH 1.500 01/16/2016   Lab Results  Component Value Date   WBC 11.8 (H) 11/16/2017   HGB 12.7 11/16/2017   HCT 39.1 11/16/2017   MCV 84.3 11/16/2017   PLT 276 11/16/2017   Lab Results  Component Value Date   NA 137 11/16/2017   K 3.3 (L) 11/16/2017   CO2 22 11/16/2017   GLUCOSE 122 (H) 11/16/2017   BUN 16 11/16/2017   CREATININE 0.72 11/16/2017   BILITOT 0.3 01/16/2016   ALKPHOS 70 01/16/2016   AST 11 01/16/2016   ALT 8 01/16/2016   PROT 6.6 01/16/2016   ALBUMIN 3.8 01/16/2016   CALCIUM 8.8 (L) 11/16/2017   ANIONGAP 11 11/16/2017   Lab Results  Component Value Date   CHOL 203 (H) 01/16/2016   Lab Results  Component Value Date   HDL 55 01/16/2016   Lab Results  Component Value Date   LDLCALC 121 (H) 01/16/2016   Lab Results  Component Value Date   TRIG 136 01/16/2016   Lab Results  Component Value Date   CHOLHDL 3.7 01/16/2016   Lab Results  Component Value Date   HGBA1C 5.6 10/31/2015       Assessment & Plan:   Problem List Items Addressed This Visit      Nervous and Auditory   Lumbar radiculopathy - Primary   Relevant Medications   gabapentin (NEURONTIN) 100 MG capsule   topiramate (TOPAMAX) 50 MG tablet   phentermine (ADIPEX-P) 37.5 MG tablet     Other   BMI 37.0-37.9, adult   Relevant Medications   topiramate (TOPAMAX) 50 MG tablet   phentermine (ADIPEX-P) 37.5 MG tablet   Weight loss counseling,  encounter for   Relevant Medications   topiramate (TOPAMAX) 50 MG tablet   phentermine (ADIPEX-P) 37.5 MG tablet    Other Visit Diagnoses    Weight gain       Relevant Medications   topiramate (TOPAMAX) 50 MG tablet       Meds ordered this encounter  Medications  . gabapentin (NEURONTIN) 100 MG capsule    Sig: Take 1-3 capsules (100-300 mg total) by mouth at bedtime.    Dispense:  90 capsule    Refill:  5    Order Specific Question:   Supervising Provider    Answer:   Janora Norlander [id number]  . topiramate (TOPAMAX) 50 MG tablet    Sig: Take 1-2 tablets (50-100 mg total) by mouth 2 (two) times daily.    Dispense:  120 tablet    Refill:  5    Order Specific Question:   Supervising Provider    Answer:   Janora Norlander [id number]  . phentermine (ADIPEX-P) 37.5 MG tablet    Sig: Take 1 tablet (37.5 mg total) by mouth daily before breakfast.    Dispense:  30 tablet    Refill:  2    Order Specific Question:   Supervising Provider    Answer:   Janora Norlander [id number]     Terald Sleeper, PA-C

## 2019-02-20 ENCOUNTER — Encounter: Payer: Self-pay | Admitting: Physician Assistant

## 2019-02-20 LAB — CMP14+EGFR
ALT: 9 IU/L (ref 0–32)
AST: 16 IU/L (ref 0–40)
Albumin/Globulin Ratio: 1.5 (ref 1.2–2.2)
Albumin: 4.2 g/dL (ref 3.8–4.8)
Alkaline Phosphatase: 92 IU/L (ref 39–117)
BUN/Creatinine Ratio: 20 (ref 9–23)
BUN: 16 mg/dL (ref 6–24)
Bilirubin Total: 0.3 mg/dL (ref 0.0–1.2)
CO2: 21 mmol/L (ref 20–29)
Calcium: 9.1 mg/dL (ref 8.7–10.2)
Chloride: 104 mmol/L (ref 96–106)
Creatinine, Ser: 0.82 mg/dL (ref 0.57–1.00)
GFR calc Af Amer: 101 mL/min/{1.73_m2} (ref >59)
GFR calc non Af Amer: 87 mL/min/{1.73_m2} (ref >59)
Globulin, Total: 2.8 g/dL (ref 1.5–4.5)
Glucose: 83 mg/dL (ref 65–99)
Potassium: 4.2 mmol/L (ref 3.5–5.2)
Sodium: 137 mmol/L (ref 134–144)
Total Protein: 7 g/dL (ref 6.0–8.5)

## 2019-02-20 LAB — CBC WITH DIFFERENTIAL/PLATELET
Basophils Absolute: 0.1 10*3/uL (ref 0.0–0.2)
Basos: 1 %
EOS (ABSOLUTE): 0.1 10*3/uL (ref 0.0–0.4)
Eos: 1 %
Hematocrit: 41.6 % (ref 34.0–46.6)
Hemoglobin: 14.3 g/dL (ref 11.1–15.9)
Immature Grans (Abs): 0 10*3/uL (ref 0.0–0.1)
Immature Granulocytes: 0 %
Lymphocytes Absolute: 1.7 10*3/uL (ref 0.7–3.1)
Lymphs: 24 %
MCH: 28.5 pg (ref 26.6–33.0)
MCHC: 34.4 g/dL (ref 31.5–35.7)
MCV: 83 fL (ref 79–97)
Monocytes Absolute: 0.6 10*3/uL (ref 0.1–0.9)
Monocytes: 8 %
Neutrophils Absolute: 4.6 10*3/uL (ref 1.4–7.0)
Neutrophils: 66 %
Platelets: 266 10*3/uL (ref 150–450)
RBC: 5.02 x10E6/uL (ref 3.77–5.28)
RDW: 13 % (ref 11.7–15.4)
WBC: 7 10*3/uL (ref 3.4–10.8)

## 2019-02-20 LAB — LIPID PANEL
Chol/HDL Ratio: 3.5 ratio (ref 0.0–4.4)
Cholesterol, Total: 166 mg/dL (ref 100–199)
HDL: 47 mg/dL (ref >39)
LDL Chol Calc (NIH): 104 mg/dL — ABNORMAL HIGH (ref 0–99)
Triglycerides: 77 mg/dL (ref 0–149)
VLDL Cholesterol Cal: 15 mg/dL (ref 5–40)

## 2019-02-20 LAB — TSH: TSH: 0.965 u[IU]/mL (ref 0.450–4.500)

## 2019-05-17 ENCOUNTER — Other Ambulatory Visit: Payer: Self-pay

## 2019-05-17 ENCOUNTER — Encounter: Payer: Self-pay | Admitting: Nurse Practitioner

## 2019-05-17 ENCOUNTER — Ambulatory Visit: Payer: BC Managed Care – PPO | Admitting: Nurse Practitioner

## 2019-05-17 VITALS — BP 144/93 | HR 89 | Temp 98.1°F | Resp 20 | Ht 64.0 in | Wt 213.0 lb

## 2019-05-17 DIAGNOSIS — M5416 Radiculopathy, lumbar region: Secondary | ICD-10-CM | POA: Diagnosis not present

## 2019-05-17 DIAGNOSIS — F339 Major depressive disorder, recurrent, unspecified: Secondary | ICD-10-CM

## 2019-05-17 DIAGNOSIS — G4700 Insomnia, unspecified: Secondary | ICD-10-CM | POA: Diagnosis not present

## 2019-05-17 DIAGNOSIS — L659 Nonscarring hair loss, unspecified: Secondary | ICD-10-CM

## 2019-05-17 DIAGNOSIS — J301 Allergic rhinitis due to pollen: Secondary | ICD-10-CM | POA: Diagnosis not present

## 2019-05-17 DIAGNOSIS — Z6836 Body mass index (BMI) 36.0-36.9, adult: Secondary | ICD-10-CM

## 2019-05-17 MED ORDER — CITALOPRAM HYDROBROMIDE 20 MG PO TABS: 20.0000 mg | ORAL_TABLET | Freq: Every day | ORAL | 1 refills | Status: DC

## 2019-05-17 MED ORDER — FINASTERIDE 5 MG PO TABS: ORAL_TABLET | ORAL | 11 refills | Status: DC

## 2019-05-17 MED ORDER — GABAPENTIN 100 MG PO CAPS: 100.0000 mg | ORAL_CAPSULE | Freq: Every day | ORAL | 1 refills | Status: DC

## 2019-05-17 MED ORDER — PHENTERMINE HCL 37.5 MG PO TABS: 37.5000 mg | ORAL_TABLET | Freq: Every day | ORAL | 2 refills | Status: DC

## 2019-05-17 MED ORDER — TOPIRAMATE 50 MG PO TABS: 50.0000 mg | ORAL_TABLET | Freq: Two times a day (BID) | ORAL | 5 refills | Status: DC

## 2019-05-17 NOTE — Progress Notes (Signed)
Subjective:    Patient ID: Judy Olson, female    DOB: 10-26-1975, 44 y.o.   MRN: [id number]   Chief Complaint: medical management of chronic issues   HPI:  1. Allergic rhinitis due to pollen, unspecified seasonality She is using xyzal and that is working well for her.  2. Depression, recurrent ([place]) She is on celexa and feels that she is under good control. Depression screen [facility] 2/9 05/17/2019 02/19/2019 08/21/2018  Decreased Interest 0 0 0  Down, Depressed, Hopeless 0 0 0  PHQ - 2 Score 0 0 0  Altered sleeping - - -  Tired, decreased energy - - -  Change in appetite - - -  Feeling bad or failure about yourself  - - -  Trouble concentrating - - -  Moving slowly or fidgety/restless - - -  Suicidal thoughts - - -  PHQ-9 Score - - -     3. Insomnia, unspecified type Sleeps well some nights. She takes melatonin prn if has not slept in sevral nights.  4. Lumbar radiculopathy She is on neuroltin 119m at bedtime only. It helps her sleep at night and helps with back pain.  5. BMI 36.0-36.9 adipex has helped with weight loss. She is also on topamax to help with appetite.   6. alopecia She sees dermatology and they have her on finesteride daily which is suppose to help.   Outpatient Encounter Medications as of 05/17/2019  Medication Sig  . albuterol (VENTOLIN HFA) 108 (90 Base) MCG/ACT inhaler Inhale 2 puffs into the lungs every 6 (six) hours as needed for wheezing or shortness of breath.  . citalopram (CELEXA) 20 MG tablet Take 1 tablet (20 mg total) by mouth daily.  . clindamycin (CLEOCIN-T) 1 % external solution Apply topically 2 (two) times daily.  .Marland KitchenELDERBERRY PO Take by mouth.  . finasteride (PROSCAR) 5 MG tablet TAKE 1/2 (ONE-HALF) TABLET BY MOUTH ONCE DAILY  . gabapentin (NEURONTIN) 100 MG capsule Take 1-3 capsules (100-300 mg total) by mouth at bedtime.  . Levocetirizine Dihydrochloride (XYZAL ALLERGY 24HR PO) Take by mouth.  . phentermine (ADIPEX-P) 37.5 MG tablet  Take 1 tablet (37.5 mg total) by mouth daily before breakfast.  . topiramate (TOPAMAX) 50 MG tablet Take 1-2 tablets (50-100 mg total) by mouth 2 (two) times daily.  .Marland Kitchentretinoin (RETIN-A) 0.025 % cream Apply to the face in the evening     Past Surgical History:  Procedure Laterality Date  . CHOLECYSTECTOMY  12/23/2010   Procedure: LAPAROSCOPIC CHOLECYSTECTOMY WITH INTRAOPERATIVE CHOLANGIOGRAM;  Surgeon: WWilley Blade MD;  Location: [facility];  Service: General;  Laterality: N/A;  LAP CHOLE WITH X-RAY  . WISDOM TOOTH EXTRACTION     in oral surgeons office    Family History  Problem Relation Age of Onset  . Cancer Mother        ovarian  . Mitral valve prolapse Mother   . Cancer Maternal Grandmother        pancreatic, lymphoma    New complaints: None today  Social history: Lives with her husband and 2 kids. 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  Eyes: Negative for pain.  Respiratory: Negative for shortness of breath.   Cardiovascular: Negative for chest pain, palpitations and leg swelling.  Gastrointestinal: Negative for abdominal pain.  Endocrine: Negative for polydipsia.  Skin: Negative for rash.       [place]  Neurological: Negative for dizziness, weakness and headaches.  Hematological: Does not bruise/bleed easily.  All other systems reviewed and are negative.      Objective:   Physical Exam Vitals and nursing note reviewed.  Constitutional:      General: She is not in acute distress.    Appearance: Normal appearance. She is well-developed.  HENT:     Head: Normocephalic.     Nose: Nose normal.  Eyes:     Pupils: Pupils are equal, round, and reactive to light.  Neck:     Vascular: No carotid bruit or JVD.  Cardiovascular:     Rate and Rhythm: Normal rate and regular rhythm.     Heart sounds: Normal heart sounds.  Pulmonary:     Effort:  Pulmonary effort is normal. No respiratory distress.     Breath sounds: Normal breath sounds. No wheezing or rales.  Chest:     Chest wall: No tenderness.  Abdominal:     General: Bowel sounds are normal. There is no distension or abdominal bruit.     Palpations: Abdomen is soft. There is no hepatomegaly, splenomegaly, mass or pulsatile mass.     Tenderness: There is no abdominal tenderness.  Musculoskeletal:        General: Normal range of motion.     Cervical back: Normal range of motion and neck supple.  Lymphadenopathy:     Cervical: No cervical adenopathy.  Skin:    General: Skin is warm and dry.     Comments: Thinning hair   Neurological:     Mental Status: She is alert and oriented to person, place, and time.     Deep Tendon Reflexes: Reflexes are normal and symmetric.  Psychiatric:        Behavior: Behavior normal.        Thought Content: Thought content normal.        Judgment: Judgment normal.       BP (!) 144/93   Pulse 89   Temp 98.1 F (36.7 C) (Temporal)   Resp 20   Ht 5' 4"  (1.626 m)   Wt 213 lb (96.6 kg)   SpO2 100%   BMI 36.56 kg/m      Assessment & Plan:  Milania Haubner comes in today with chief complaint of No chief complaint on file.   Diagnosis and orders addressed:  1. Allergic rhinitis due to pollen, unspecified seasonality contniue xyzal as rx  2. Depression, recurrent ([place]) Stress management handout given - citalopram (CELEXA) 20 MG tablet; Take 1 tablet (20 mg total) by mouth daily.  Dispense: 90 tablet; Refill: 1 - CBC with Differential/Platelet - CMP14+EGFR - Lipid panel  3. Insomnia, unspecified type Bedtime routine  4. Lumbar radiculopathy Moist heat rest - gabapentin (NEURONTIN) 100 MG capsule; Take 1 capsule (100 mg total) by mouth at bedtime.  Dispense: 90 capsule; Refill: 1  5. BMI 36.0-36.9,adult Discussed diet and exercise for person with BMI >25 Will recheck weight in 3-6 months - topiramate (TOPAMAX) 50 MG  tablet; Take 1-2 tablets (50-100 mg total) by mouth 2 (two) times daily.  Dispense: 120 tablet; Refill: 5 - phentermine (ADIPEX-P) 37.5 MG tablet; Take 1 tablet (37.5 mg total) by mouth daily before breakfast.  Dispense: 30 tablet; Refill: 2  6. Alopecia continue with dermatology - finasteride (PROSCAR) 5 MG tablet; TAKE 1/2 (ONE-HALF) TABLET BY MOUTH ONCE DAILY  Dispense: 45 tablet; Refill: 11   Labs pending Health Maintenance reviewed Diet and exercise encouraged  Follow up plan:  6 months   [place], FNP

## 2019-05-17 NOTE — Patient Instructions (Signed)
Stress, Adult Stress is a normal reaction to life events. Stress is what you feel when life demands more than you are used to, or more than you think you can handle. Some stress can be useful, such as studying for a test or meeting a deadline at work. Stress that occurs too often or for too long can cause problems. It can affect your emotional health and interfere with relationships and normal daily activities. Too much stress can weaken your body's defense system (immune system) and increase your risk for physical illness. If you already have a medical problem, stress can make it worse. What are the causes? All sorts of life events can cause stress. An event that causes stress for one person may not be stressful for another person. Major life events, whether positive or negative, commonly cause stress. Examples include:  Losing a job or starting a new job.  Losing a loved one.  Moving to a new town or home.  Getting married or divorced.  Having a baby.  Getting injured or sick. Less obvious life events can also cause stress, especially if they occur day after day or in combination with each other. Examples include:  Working long hours.  Driving in traffic.  Caring for children.  Being in debt.  Being in a difficult relationship. What are the signs or symptoms? Stress can cause emotional symptoms, including:  Anxiety. This is feeling worried, afraid, on edge, overwhelmed, or out of control.  Anger, including irritation or impatience.  Depression. This is feeling sad, down, helpless, or guilty.  Trouble focusing, remembering, or making decisions. Stress can cause physical symptoms, including:  Aches and pains. These may affect your head, neck, back, stomach, or other areas of your body.  Tight muscles or a clenched jaw.  Low energy.  Trouble sleeping. Stress can cause unhealthy behaviors, including:  Eating to feel better (overeating) or skipping meals.  Working too  much or putting off tasks.  Smoking, drinking alcohol, or using drugs to feel better. How is this diagnosed? Stress is diagnosed through an assessment by your health care provider. He or she may diagnose this condition based on:  Your symptoms and any stressful life events.  Your medical history.  Tests to rule out other causes of your symptoms. Depending on your condition, your health care provider may refer you to a specialist for further evaluation. How is this treated?  Stress management techniques are the recommended treatment for stress. Medicine is not typically recommended for the treatment of stress. Techniques to reduce your reaction to stressful life events include:  Stress identification. Monitor yourself for symptoms of stress and identify what causes stress for you. These skills may help you to avoid or prepare for stressful events.  Time management. Set your priorities, keep a calendar of events, and learn to say no. Taking these actions can help you avoid making too many commitments. Techniques for coping with stress include:  Rethinking the problem. Try to think realistically about stressful events rather than ignoring them or overreacting. Try to find the positives in a stressful situation rather than focusing on the negatives.  Exercise. Physical exercise can release both physical and emotional tension. The key is to find a form of exercise that you enjoy and do it regularly.  Relaxation techniques. These relax the body and mind. The key is to find one or more that you enjoy and use the techniques regularly. Examples include: ? Meditation, deep breathing, or progressive relaxation techniques. ? Yoga or   tai chi. ? Biofeedback, mindfulness techniques, or journaling. ? Listening to music, being out in nature, or participating in other hobbies.  Practicing a healthy lifestyle. Eat a balanced diet, drink plenty of water, limit or avoid caffeine, and get plenty of  sleep.  Having a strong support network. Spend time with family, friends, or other people you enjoy being around. Express your feelings and talk things over with someone you trust. Counseling or talk therapy with a mental health professional may be helpful if you are having trouble managing stress on your own. Follow these instructions at home: Lifestyle   Avoid drugs.  Do not use any products that contain nicotine or tobacco, such as cigarettes, e-cigarettes, and chewing tobacco. If you need help quitting, ask your health care provider.  Limit alcohol intake to no more than 1 drink a day for nonpregnant women and 2 drinks a day for men. One drink equals 12 oz of beer, 5 oz of wine, or 1 oz of hard liquor  Do not use alcohol or drugs to relax.  Eat a balanced diet that includes fresh fruits and vegetables, whole grains, lean meats, fish, eggs, and beans, and low-fat dairy. Avoid processed foods and foods high in added fat, sugar, and salt.  Exercise at least 30 minutes on 5 or more days each week.  Get 7-8 hours of sleep each night. General instructions   Practice stress management techniques as discussed with your health care provider.  Drink enough fluid to keep your urine clear or pale yellow.  Take over-the-counter and prescription medicines only as told by your health care provider.  Keep all follow-up visits as told by your health care provider. This is important. Contact a health care provider if:  Your symptoms get worse.  You have new symptoms.  You feel overwhelmed by your problems and can no longer manage them on your own. Get help right away if:  You have thoughts of hurting yourself or others. If you ever feel like you may hurt yourself or others, or have thoughts about taking your own life, get help right away. You can go to your nearest emergency department or call:  Your local emergency services (911 in the [place]  A suicide crisis helpline, such as the  [place] at (316) 250-6172. This is open 24 hours a day. Summary  Stress is a normal reaction to life events. It can cause problems if it happens too often or for too long.  Practicing stress management techniques is the best way to treat stress.  Counseling or talk therapy with a mental health professional may be helpful if you are having trouble managing stress on your own. This information is not intended to replace advice given to you by your health care provider. Make sure you discuss any questions you have with your health care provider. Document Revised: 08/18/2018 Document Reviewed: 03/10/2016 Elsevier Patient Education  [place].

## 2019-05-18 LAB — CBC WITH DIFFERENTIAL/PLATELET
Basophils Absolute: 0.1 10*3/uL (ref 0.0–0.2)
Basos: 1 %
EOS (ABSOLUTE): 0.1 10*3/uL (ref 0.0–0.4)
Eos: 1 %
Hematocrit: 40.9 % (ref 34.0–46.6)
Hemoglobin: 13.7 g/dL (ref 11.1–15.9)
Immature Grans (Abs): 0 10*3/uL (ref 0.0–0.1)
Immature Granulocytes: 0 %
Lymphocytes Absolute: 2.1 10*3/uL (ref 0.7–3.1)
Lymphs: 26 %
MCH: 28.2 pg (ref 26.6–33.0)
MCHC: 33.5 g/dL (ref 31.5–35.7)
MCV: 84 fL (ref 79–97)
Monocytes Absolute: 0.6 10*3/uL (ref 0.1–0.9)
Monocytes: 7 %
Neutrophils Absolute: 5.3 10*3/uL (ref 1.4–7.0)
Neutrophils: 65 %
Platelets: 255 10*3/uL (ref 150–450)
RBC: 4.86 x10E6/uL (ref 3.77–5.28)
RDW: 13.2 % (ref 11.7–15.4)
WBC: 8.1 10*3/uL (ref 3.4–10.8)

## 2019-05-18 LAB — LIPID PANEL
Chol/HDL Ratio: 3.8 ratio (ref 0.0–4.4)
Cholesterol, Total: 171 mg/dL (ref 100–199)
HDL: 45 mg/dL (ref >39)
LDL Chol Calc (NIH): 109 mg/dL — ABNORMAL HIGH (ref 0–99)
Triglycerides: 93 mg/dL (ref 0–149)
VLDL Cholesterol Cal: 17 mg/dL (ref 5–40)

## 2019-05-18 LAB — CMP14+EGFR
ALT: 7 IU/L (ref 0–32)
AST: 17 IU/L (ref 0–40)
Albumin/Globulin Ratio: 1.9 (ref 1.2–2.2)
Albumin: 4.5 g/dL (ref 3.8–4.8)
Alkaline Phosphatase: 87 IU/L (ref 39–117)
BUN/Creatinine Ratio: 21 (ref 9–23)
BUN: 16 mg/dL (ref 6–24)
Bilirubin Total: 0.3 mg/dL (ref 0.0–1.2)
CO2: 21 mmol/L (ref 20–29)
Calcium: 9.3 mg/dL (ref 8.7–10.2)
Chloride: 106 mmol/L (ref 96–106)
Creatinine, Ser: 0.77 mg/dL (ref 0.57–1.00)
GFR calc Af Amer: 108 mL/min/{1.73_m2} (ref >59)
GFR calc non Af Amer: 94 mL/min/{1.73_m2} (ref >59)
Globulin, Total: 2.4 g/dL (ref 1.5–4.5)
Glucose: 90 mg/dL (ref 65–99)
Potassium: 4.1 mmol/L (ref 3.5–5.2)
Sodium: 138 mmol/L (ref 134–144)
Total Protein: 6.9 g/dL (ref 6.0–8.5)

## 2019-05-23 ENCOUNTER — Ambulatory Visit: Payer: BC Managed Care – PPO | Admitting: Physician Assistant

## 2019-11-22 ENCOUNTER — Other Ambulatory Visit: Payer: Self-pay | Admitting: *Deleted

## 2019-11-22 DIAGNOSIS — J209 Acute bronchitis, unspecified: Secondary | ICD-10-CM

## 2019-11-22 MED ORDER — DOXYCYCLINE HYCLATE 100 MG PO TABS: 100.0000 mg | ORAL_TABLET | Freq: Two times a day (BID) | ORAL | 0 refills | Status: DC

## 2019-12-23 ENCOUNTER — Other Ambulatory Visit: Payer: Self-pay | Admitting: Nurse Practitioner

## 2019-12-23 DIAGNOSIS — J209 Acute bronchitis, unspecified: Secondary | ICD-10-CM

## 2020-03-10 ENCOUNTER — Encounter: Payer: Self-pay | Admitting: Nurse Practitioner

## 2020-03-10 ENCOUNTER — Ambulatory Visit: Payer: BC Managed Care – PPO | Admitting: Nurse Practitioner

## 2020-03-10 ENCOUNTER — Other Ambulatory Visit: Payer: Self-pay

## 2020-03-10 VITALS — BP 141/93 | HR 69 | Temp 97.9°F | Resp 20 | Ht 64.0 in | Wt 224.0 lb

## 2020-03-10 DIAGNOSIS — M5416 Radiculopathy, lumbar region: Secondary | ICD-10-CM | POA: Diagnosis not present

## 2020-03-10 DIAGNOSIS — Z6838 Body mass index (BMI) 38.0-38.9, adult: Secondary | ICD-10-CM | POA: Diagnosis not present

## 2020-03-10 DIAGNOSIS — G4700 Insomnia, unspecified: Secondary | ICD-10-CM

## 2020-03-10 DIAGNOSIS — Z1231 Encounter for screening mammogram for malignant neoplasm of breast: Secondary | ICD-10-CM

## 2020-03-10 DIAGNOSIS — F339 Major depressive disorder, recurrent, unspecified: Secondary | ICD-10-CM

## 2020-03-10 DIAGNOSIS — L700 Acne vulgaris: Secondary | ICD-10-CM

## 2020-03-10 MED ORDER — MINOCYCLINE HCL 100 MG PO CAPS: 100.0000 mg | ORAL_CAPSULE | Freq: Two times a day (BID) | ORAL | 2 refills | Status: DC

## 2020-03-10 MED ORDER — CITALOPRAM HYDROBROMIDE 20 MG PO TABS: 20.0000 mg | ORAL_TABLET | Freq: Every day | ORAL | 1 refills | Status: DC

## 2020-03-10 MED ORDER — GABAPENTIN 100 MG PO CAPS: 100.0000 mg | ORAL_CAPSULE | Freq: Every day | ORAL | 1 refills | Status: DC

## 2020-03-10 MED ORDER — TRETINOIN 0.025 % EX CREA: TOPICAL_CREAM | CUTANEOUS | 11 refills | Status: DC

## 2020-03-10 MED ORDER — CLINDAMYCIN PHOSPHATE 1 % EX SOLN: Freq: Two times a day (BID) | CUTANEOUS | 11 refills | Status: AC

## 2020-03-10 NOTE — Patient Instructions (Signed)
Calorie Counting for Weight Loss Calories are units of energy. Your body needs a certain number of calories from food to keep going throughout the day. When you eat or drink more calories than your body needs, your body stores the extra calories mostly as fat. When you eat or drink fewer calories than your body needs, your body burns fat to get the energy it needs. Calorie counting means keeping track of how many calories you eat and drink each day. Calorie counting can be helpful if you need to lose weight. If you eat fewer calories than your body needs, you should lose weight. Ask your health care provider what a healthy weight is for you. For calorie counting to work, you will need to eat the right number of calories each day to lose a healthy amount of weight per week. A dietitian can help you figure out how many calories you need in a day and will suggest ways to reach your calorie goal.  A healthy amount of weight to lose each week is usually 1-2 lb (0.5-0.9 kg). This usually means that your daily calorie intake should be reduced by 500-750 calories.  Eating 1,200-1,500 calories a day can help most women lose weight.  Eating 1,500-1,800 calories a day can help most men lose weight. What do I need to know about calorie counting? Work with your health care provider or dietitian to determine how many calories you should get each day. To meet your daily calorie goal, you will need to:  Find out how many calories are in each food that you would like to eat. Try to do this before you eat.  Decide how much of the food you plan to eat.  Keep a food log. Do this by writing down what you ate and how many calories it had. To successfully lose weight, it is important to balance calorie counting with a healthy lifestyle that includes regular activity. Where do I find calorie information? The number of calories in a food can be found on a Nutrition Facts label. If a food does not have a Nutrition Facts  label, try to look up the calories online or ask your dietitian for help. Remember that calories are listed per serving. If you choose to have more than one serving of a food, you will have to multiply the calories per serving by the number of servings you plan to eat. For example, the label on a package of bread might say that a serving size is 1 slice and that there are 90 calories in a serving. If you eat 1 slice, you will have eaten 90 calories. If you eat 2 slices, you will have eaten 180 calories.   How do I keep a food log? After each time that you eat, record the following in your food log as soon as possible:  What you ate. Be sure to include toppings, sauces, and other extras on the food.  How much you ate. This can be measured in cups, ounces, or number of items.  How many calories were in each food and drink.  The total number of calories in the food you ate. Keep your food log near you, such as in a pocket-sized notebook or on an app or website on your mobile phone. Some programs will calculate calories for you and show you how many calories you have left to meet your daily goal. What are some portion-control tips?  Know how many calories are in a serving. This will   help you know how many servings you can have of a certain food.  Use a measuring cup to measure serving sizes. You could also try weighing out portions on a kitchen scale. With time, you will be able to estimate serving sizes for some foods.  Take time to put servings of different foods on your favorite plates or in your favorite bowls and cups so you know what a serving looks like.  Try not to eat straight from a food's packaging, such as from a bag or box. Eating straight from the package makes it hard to see how much you are eating and can lead to overeating. Put the amount you would like to eat in a cup or on a plate to make sure you are eating the right portion.  Use smaller plates, glasses, and bowls for smaller  portions and to prevent overeating.  Try not to multitask. For example, avoid watching TV or using your computer while eating. If it is time to eat, sit down at a table and enjoy your food. This will help you recognize when you are full. It will also help you be more mindful of what and how much you are eating. What are tips for following this plan? Reading food labels  Check the calorie count compared with the serving size. The serving size may be smaller than what you are used to eating.  Check the source of the calories. Try to choose foods that are high in protein, fiber, and vitamins, and low in saturated fat, trans fat, and sodium. Shopping  Read nutrition labels while you shop. This will help you make healthy decisions about which foods to buy.  Pay attention to nutrition labels for low-fat or fat-free foods. These foods sometimes have the same number of calories or more calories than the full-fat versions. They also often have added sugar, starch, or salt to make up for flavor that was removed with the fat.  Make a grocery list of lower-calorie foods and stick to it. Cooking  Try to cook your favorite foods in a healthier way. For example, try baking instead of frying.  Use low-fat dairy products. Meal planning  Use more fruits and vegetables. One-half of your plate should be fruits and vegetables.  Include lean proteins, such as chicken, turkey, and fish. Lifestyle Each week, aim to do one of the following:  150 minutes of moderate exercise, such as walking.  75 minutes of vigorous exercise, such as running. General information  Know how many calories are in the foods you eat most often. This will help you calculate calorie counts faster.  Find a way of tracking calories that works for you. Get creative. Try different apps or programs if writing down calories does not work for you. What foods should I eat?  Eat nutritious foods. It is better to have a nutritious,  high-calorie food, such as an avocado, than a food with few nutrients, such as a bag of potato chips.  Use your calories on foods and drinks that will fill you up and will not leave you hungry soon after eating. ? Examples of foods that fill you up are nuts and nut butters, vegetables, lean proteins, and high-fiber foods such as whole grains. High-fiber foods are foods with more than 5 g of fiber per serving.  Pay attention to calories in drinks. Low-calorie drinks include water and unsweetened drinks. The items listed above may not be a complete list of foods and beverages you can eat.   Contact a dietitian for more information.   What foods should I limit? Limit foods or drinks that are not good sources of vitamins, minerals, or protein or that are high in unhealthy fats. These include:  Candy.  Other sweets.  Sodas, specialty coffee drinks, alcohol, and juice. The items listed above may not be a complete list of foods and beverages you should avoid. Contact a dietitian for more information. How do I count calories when eating out?  Pay attention to portions. Often, portions are much larger when eating out. Try these tips to keep portions smaller: ? Consider sharing a meal instead of getting your own. ? If you get your own meal, eat only half of it. Before you start eating, ask for a container and put half of your meal into it. ? When available, consider ordering smaller portions from the menu instead of full portions.  Pay attention to your food and drink choices. Knowing the way food is cooked and what is included with the meal can help you eat fewer calories. ? If calories are listed on the menu, choose the lower-calorie options. ? Choose dishes that include vegetables, fruits, whole grains, low-fat dairy products, and lean proteins. ? Choose items that are boiled, broiled, grilled, or steamed. Avoid items that are buttered, battered, fried, or served with cream sauce. Items labeled as  crispy are usually fried, unless stated otherwise. ? Choose water, low-fat milk, unsweetened iced tea, or other drinks without added sugar. If you want an alcoholic beverage, choose a lower-calorie option, such as a glass of wine or light beer. ? Ask for dressings, sauces, and syrups on the side. These are usually high in calories, so you should limit the amount you eat. ? If you want a salad, choose a garden salad and ask for grilled meats. Avoid extra toppings such as bacon, cheese, or fried items. Ask for the dressing on the side, or ask for olive oil and vinegar or lemon to use as dressing.  Estimate how many servings of a food you are given. Knowing serving sizes will help you be aware of how much food you are eating at restaurants. Where to find more information  Centers for Disease Control and Prevention: www.cdc.gov  [place] Department of Agriculture: myplate.gov Summary  Calorie counting means keeping track of how many calories you eat and drink each day. If you eat fewer calories than your body needs, you should lose weight.  A healthy amount of weight to lose per week is usually 1-2 lb (0.5-0.9 kg). This usually means reducing your daily calorie intake by 500-750 calories.  The number of calories in a food can be found on a Nutrition Facts label. If a food does not have a Nutrition Facts label, try to look up the calories online or ask your dietitian for help.  Use smaller plates, glasses, and bowls for smaller portions and to prevent overeating.  Use your calories on foods and drinks that will fill you up and not leave you hungry shortly after a meal. This information is not intended to replace advice given to you by your health care provider. Make sure you discuss any questions you have with your health care provider. Document Revised: 03/01/2019 Document Reviewed: 03/01/2019 Elsevier Patient Education  2021 Elsevier Inc.  

## 2020-03-10 NOTE — Progress Notes (Signed)
Subjective:    Patient ID: Judy Olson, female    DOB: 10-28-1975, 45 y.o.   MRN: [id number]   Chief Complaint: medical management of chronic issues     HPI:  1. Depression, recurrent ([place]) Patient is currently on celexa daily- she says she s doing well. denies any medication side effects Depression screen [facility] 2/9 03/10/2020 05/17/2019 02/19/2019  Decreased Interest 0 0 0  Down, Depressed, Hopeless 0 0 0  PHQ - 2 Score 0 0 0  Altered sleeping 0 - -  Tired, decreased energy 1 - -  Change in appetite 0 - -  Feeling bad or failure about yourself  0 - -  Trouble concentrating 0 - -  Moving slowly or fidgety/restless 0 - -  Suicidal thoughts 0 - -  PHQ-9 Score 1 - -  Difficult doing work/chores Not difficult at all - -     2. Insomnia, unspecified type Sleeps well some nights. Says she sleeps about 6 hours a niught  But she gets up several times at night to sleep  3. Lumbar radiculopathy Has chronic back pain in which she takes Neurontin for . She says it is working well. Also helps her left knee pain.  4. Acne Vulgaris Mainly broke out from wearing mask. Needs refill on retin-a  5. BMI 36.0-36.9,adult She was on topamax and adipex combination last year. Weight is up. Says she doe snot eat 3c a day. Has no time to exercise. Wt Readings from Last 3 Encounters:  03/10/20 224 lb (101.6 kg)  05/17/19 213 lb (96.6 kg)  02/19/19 219 lb (99.3 kg)   BMI Readings from Last 3 Encounters:  03/10/20 38.45 kg/m  05/17/19 36.56 kg/m  02/19/19 37.59 kg/m       Outpatient Encounter Medications as of 03/10/2020  Medication Sig  . albuterol (VENTOLIN HFA) 108 (90 Base) MCG/ACT inhaler Inhale 2 puffs into the lungs every 6 (six) hours as needed for wheezing or shortness of breath.  . citalopram (CELEXA) 20 MG tablet Take 1 tablet (20 mg total) by mouth daily.  . clindamycin (CLEOCIN-T) 1 % external solution Apply topically 2 (two) times daily.  Marland Kitchen doxycycline (VIBRA-TABS) 100 MG  tablet Take 1 tablet (100 mg total) by mouth 2 (two) times daily.  Marland Kitchen ELDERBERRY PO Take by mouth.  . finasteride (PROSCAR) 5 MG tablet TAKE 1/2 (ONE-HALF) TABLET BY MOUTH ONCE DAILY  . gabapentin (NEURONTIN) 100 MG capsule Take 1 capsule (100 mg total) by mouth at bedtime.  . Levocetirizine Dihydrochloride (XYZAL ALLERGY 24HR PO) Take by mouth.  . phentermine (ADIPEX-P) 37.5 MG tablet Take 1 tablet (37.5 mg total) by mouth daily before breakfast.  . topiramate (TOPAMAX) 50 MG tablet Take 1-2 tablets (50-100 mg total) by mouth 2 (two) times daily.  Marland Kitchen tretinoin (RETIN-A) 0.025 % cream Apply to the face in the evening   No facility-administered encounter medications on file as of 03/10/2020.    Past Surgical History:  Procedure Laterality Date  . CHOLECYSTECTOMY  12/23/2010   Procedure: LAPAROSCOPIC CHOLECYSTECTOMY WITH INTRAOPERATIVE CHOLANGIOGRAM;  Surgeon: Willey Blade, MD;  Location: [facility];  Service: General;  Laterality: N/A;  LAP CHOLE WITH X-RAY  . WISDOM TOOTH EXTRACTION     in oral surgeons office    Family History  Problem Relation Age of Onset  . Cancer Mother        ovarian  . Mitral valve prolapse Mother   . Cancer Maternal Grandmother  pancreatic, lymphoma    New complaints: None today  Social history: Lives with her kids. Works in the school system.  Controlled substance contract: n/a    Review of Systems  Constitutional: Negative for diaphoresis.  Eyes: Negative for pain.  Respiratory: Negative for shortness of breath.   Cardiovascular: Negative for chest pain, palpitations and leg swelling.  Gastrointestinal: Negative for abdominal pain.  Endocrine: Negative for polydipsia.  Skin: Negative for rash.  Neurological: Negative for dizziness, weakness and headaches.  Hematological: Does not bruise/bleed easily.  All other systems reviewed and are negative.      Objective:   Physical Exam Vitals and nursing note reviewed.  Constitutional:       General: She is not in acute distress.    Appearance: Normal appearance. She is well-developed and well-nourished.  HENT:     Head: Normocephalic.     Nose: Nose normal.     Mouth/Throat:     Mouth: Oropharynx is clear and moist.  Eyes:     Extraocular Movements: EOM normal.     Pupils: Pupils are equal, round, and reactive to light.  Neck:     Vascular: No carotid bruit or JVD.  Cardiovascular:     Rate and Rhythm: Normal rate and regular rhythm.     Pulses: Intact distal pulses.     Heart sounds: Normal heart sounds.  Pulmonary:     Effort: Pulmonary effort is normal. No respiratory distress.     Breath sounds: Normal breath sounds. No wheezing or rales.  Chest:     Chest wall: No tenderness.  Abdominal:     General: Bowel sounds are normal. There is no distension or abdominal bruit. Aorta is normal.     Palpations: Abdomen is soft. There is no hepatomegaly, splenomegaly, mass or pulsatile mass.     Tenderness: There is no abdominal tenderness.  Musculoskeletal:        General: No edema. Normal range of motion.     Cervical back: Normal range of motion and neck supple.  Lymphadenopathy:     Cervical: No cervical adenopathy.  Skin:    General: Skin is warm and dry.  Neurological:     Mental Status: She is alert and oriented to person, place, and time.     Deep Tendon Reflexes: Reflexes are normal and symmetric.  Psychiatric:        Mood and Affect: Mood and affect normal.        Behavior: Behavior normal.        Thought Content: Thought content normal.        Judgment: Judgment normal.     BP (!) 141/93   Pulse 69   Temp 97.9 F (36.6 C) (Temporal)   Resp 20   Ht 5\' 4"  (1.626 m)   Wt 224 lb (101.6 kg)   SpO2 97%   BMI 38.45 kg/m        Assessment & Plan:  Judy Olson in today with chief complaint of Medical Management of Chronic Issues   1. Depression, recurrent ([place]) Stress management - citalopram (CELEXA) 20 MG tablet; Take 1 tablet (20 mg  total) by mouth daily.  Dispense: 90 tablet; Refill: 1  2. Insomnia, unspecified type Bedtime routine  3. Lumbar radiculopathy Back and knee stretches - gabapentin (NEURONTIN) 100 MG capsule; Take 1 capsule (100 mg total) by mouth at bedtime.  Dispense: 90 capsule; Refill: 1  4. BMI 38.0-38.9,adult Discussed diet and exercise for person with BMI >25 Will recheck  weight in 3-6 months  5. Acne vulgaris Clean face BID with antibacterial soap - tretinoin (RETIN-A) 0.025 % cream; Apply to the face in the evening  Dispense: 45 g; Refill: 11 - minocycline (MINOCIN) 100 MG capsule; Take 1 capsule (100 mg total) by mouth 2 (two) times daily.  Dispense: 30 capsule; Refill: 2 - clindamycin (CLEOCIN-T) 1 % external solution; Apply topically 2 (two) times daily.  Dispense: 60 mL; Refill: 11  patient will schedule mammogram on way out.  The above assessment and management plan was discussed with the patient. The patient verbalized understanding of and has agreed to the management plan. Patient is aware to call the clinic if symptoms persist or worsen. Patient is aware when to return to the clinic for a follow-up visit. Patient educated on when it is appropriate to go to the emergency department.   Mary-Margaret Daphine Deutscher, FNP

## 2020-04-07 ENCOUNTER — Other Ambulatory Visit: Payer: Self-pay | Admitting: Nurse Practitioner

## 2020-04-07 DIAGNOSIS — Z6836 Body mass index (BMI) 36.0-36.9, adult: Secondary | ICD-10-CM

## 2020-04-07 MED ORDER — PHENTERMINE HCL 37.5 MG PO TABS: 37.5000 mg | ORAL_TABLET | Freq: Every day | ORAL | 2 refills | Status: DC

## 2020-04-07 MED ORDER — CITALOPRAM HYDROBROMIDE 40 MG PO TABS: 40.0000 mg | ORAL_TABLET | Freq: Every day | ORAL | 2 refills | Status: DC

## 2020-04-07 NOTE — Progress Notes (Unsigned)
Prescription sent in for adipex Changed celexa to 40mg  1 po daily- prescription was sent o the pharmacy. follo wup in 1 month.

## 2020-04-07 NOTE — Progress Notes (Signed)
lmtcb

## 2020-07-28 ENCOUNTER — Other Ambulatory Visit: Payer: Self-pay | Admitting: Nurse Practitioner

## 2020-07-28 DIAGNOSIS — Z6836 Body mass index (BMI) 36.0-36.9, adult: Secondary | ICD-10-CM

## 2020-08-11 ENCOUNTER — Other Ambulatory Visit: Payer: Self-pay | Admitting: Nurse Practitioner

## 2020-08-11 DIAGNOSIS — L659 Nonscarring hair loss, unspecified: Secondary | ICD-10-CM

## 2020-08-30 ENCOUNTER — Other Ambulatory Visit: Payer: Self-pay | Admitting: Nurse Practitioner

## 2020-09-02 ENCOUNTER — Other Ambulatory Visit: Payer: Self-pay

## 2020-09-02 ENCOUNTER — Ambulatory Visit: Payer: BC Managed Care – PPO | Admitting: Nurse Practitioner

## 2020-09-02 ENCOUNTER — Encounter: Payer: Self-pay | Admitting: Nurse Practitioner

## 2020-09-02 VITALS — BP 137/89 | HR 81 | Temp 98.3°F | Ht 64.0 in | Wt 226.2 lb

## 2020-09-02 DIAGNOSIS — F339 Major depressive disorder, recurrent, unspecified: Secondary | ICD-10-CM | POA: Diagnosis not present

## 2020-09-02 DIAGNOSIS — G8929 Other chronic pain: Secondary | ICD-10-CM

## 2020-09-02 DIAGNOSIS — L659 Nonscarring hair loss, unspecified: Secondary | ICD-10-CM | POA: Diagnosis not present

## 2020-09-02 DIAGNOSIS — F5101 Primary insomnia: Secondary | ICD-10-CM | POA: Diagnosis not present

## 2020-09-02 DIAGNOSIS — Z6838 Body mass index (BMI) 38.0-38.9, adult: Secondary | ICD-10-CM

## 2020-09-02 DIAGNOSIS — M25562 Pain in left knee: Secondary | ICD-10-CM

## 2020-09-02 MED ORDER — CITALOPRAM HYDROBROMIDE 40 MG PO TABS: 40.0000 mg | ORAL_TABLET | Freq: Every day | ORAL | 1 refills | Status: DC

## 2020-09-02 MED ORDER — GABAPENTIN 100 MG PO CAPS: 100.0000 mg | ORAL_CAPSULE | Freq: Every day | ORAL | 1 refills | Status: DC

## 2020-09-02 MED ORDER — FINASTERIDE 5 MG PO TABS: ORAL_TABLET | ORAL | 1 refills | Status: DC

## 2020-09-02 NOTE — Patient Instructions (Signed)
Fat and Cholesterol Restricted Eating Plan Eating a diet that limits fat and cholesterol may help lower your risk for heart disease and other conditions. Your body needs fat and cholesterol for basic functions, but eating too much of these things can be harmful to yourhealth. Your health care provider may order lab tests to check your blood fat (lipid) and cholesterol levels. This helps your health care provider understand your risk for certain conditions and whether you need to make diet changes. Work with your health care provider or dietitian to make an eating plan that isright for you. Your plan includes: Limit your fat intake to ______% or less of your total calories a day. Limit your saturated fat intake to ______% or less of your total calories a day. Limit the amount of cholesterol in your diet to less than _________mg a day. Eat ___________ g of fiber a day. What are tips for following this plan? General guidelines  If you are overweight, work with your health care provider to lose weight safely. Losing just 5-10% of your body weight can improve your overall health and help prevent diseases such as diabetes and heart disease. Avoid: Foods with added sugar. Fried foods. Foods that contain partially hydrogenated oils, including stick margarine, some tub margarines, cookies, crackers, and other baked goods. Limit alcohol intake to no more than 1 drink a day for nonpregnant women and 2 drinks a day for men. One drink equals 12 oz of beer, 5 oz of wine, or 1 oz of hard liquor.  Reading food labels Check food labels for: Trans fats, partially hydrogenated oils, or high amounts of saturated fat. Avoid foods that contain saturated fat and trans fat. The amount of cholesterol in each serving. Try to eat no more than 200 mg of cholesterol each day. The amount of fiber in each serving. Try to eat at least 20-30 g of fiber each day. Choose foods with healthy fats, such as: Monounsaturated and  polyunsaturated fats. These include olive and canola oil, flaxseeds, walnuts, almonds, and seeds. Omega-3 fats. These are found in foods such as salmon, mackerel, sardines, tuna, flaxseed oil, and ground flaxseeds. Choose grain products that have whole grains. Look for the word "whole" as the first word in the ingredient list. Cooking Cook foods using methods other than frying. Baking, boiling, grilling, and broiling are some healthy options. Eat more home-cooked food and less restaurant, buffet, and fast food. Avoid cooking using saturated fats. Animal sources of saturated fats include meats, butter, and cream. Plant sources of saturated fats include palm oil, palm kernel oil, and coconut oil. Meal planning  At meals, imagine dividing your plate into fourths: Fill one-half of your plate with vegetables and green salads. Fill one-fourth of your plate with whole grains. Fill one-fourth of your plate with lean protein foods. Eat fish that is high in omega-3 fats at least two times a week. Eat more foods that contain fiber, such as whole grains, beans, apples, broccoli, carrots, peas, and barley. These foods help promote healthy cholesterol levels in the blood.  Recommended foods Grains Whole grains, such as whole wheat or whole grain breads, crackers, cereals, and pasta. Unsweetened oatmeal, bulgur, barley, quinoa, or brown rice. Corn or whole wheat flour tortillas. Vegetables Fresh or frozen vegetables (raw, steamed, roasted, or grilled). Green salads. Fruits All fresh, canned (in natural juice), or frozen fruits. Meats and other protein foods Ground beef (85% or leaner), grass-fed beef, or beef trimmed of fat. Skinless chicken or [place]. Ground  chicken or [place]. Pork trimmed of fat. All fish and seafood. Egg whites. Dried beans, peas, or lentils. Unsalted nuts or seeds. Unsalted canned beans. Natural nut butters without added sugar and oil. Dairy Low-fat or nonfat dairy products, such as  skim or 1% milk, 2% or reduced-fat cheeses, low-fat and fat-free ricotta or cottage cheese, or plain low-fat and nonfat yogurt. Fats and oils Tub margarine without trans fats. Light or reduced-fat mayonnaise and salad dressings. Avocado. Olive, canola, sesame, or safflower oils. The items listed above may not be a complete list of foods and beverages you can eat. Contact a dietitian for more information. Foods to avoid Grains White bread. White pasta. White rice. Cornbread. Bagels, pastries, and croissants. Crackers and snack foods that contain trans fat and hydrogenated oils. Vegetables Vegetables cooked in cheese, cream, or butter sauce. Fried vegetables. Fruits Canned fruit in heavy syrup. Fruit in cream or butter sauce. Fried fruit. Meats and other protein foods Fatty cuts of meat. Ribs, chicken wings, bacon, sausage, bologna, salami, chitterlings, fatback, hot dogs, bratwurst, and packaged lunch meats. Liver and organ meats. Whole eggs and egg yolks. Chicken and [place] with skin. Fried meat. Dairy Whole or 2% milk, cream, half-and-half, and cream cheese. Whole milk cheeses. Whole-fat or sweetened yogurt. Full-fat cheeses. Nondairy creamers and whipped toppings. Processed cheese, cheese spreads, and cheese curds. Beverages Alcohol. Sugar-sweetened drinks such as sodas, lemonade, and fruit drinks. Fats and oils Butter, stick margarine, lard, shortening, ghee, or bacon fat. Coconut, palm kernel, and palm oils. Sweets and desserts Corn syrup, sugars, honey, and molasses. Candy. Jam and jelly. Syrup. Sweetened cereals. Cookies, pies, cakes, donuts, muffins, and ice cream. The items listed above may not be a complete list of foods and beverages you should avoid. Contact a dietitian for more information. Summary Your body needs fat and cholesterol for basic functions. However, eating too much of these things can be harmful to your health. Work with your health care provider and dietitian to  follow a diet low in fat and cholesterol. Doing this may help lower your risk for heart disease and other conditions. Choose healthy fats, such as monounsaturated and polyunsaturated fats, and foods high in omega-3 fatty acids. Eat fiber-rich foods, such as whole grains, beans, peas, fruits, and vegetables. Limit or avoid alcohol, fried foods, and foods high in saturated fats, partially hydrogenated oils, and sugar. This information is not intended to replace advice given to you by your health care provider. Make sure you discuss any questions you have with your healthcare provider. Document Revised: 09/19/2019 Document Reviewed: 05/23/2019 Elsevier Patient Education  2022 [employer].

## 2020-09-02 NOTE — Progress Notes (Signed)
Subjective:    Patient ID: Judy Olson, female    DOB: [birth year], 45 y.o.   MRN: [id number]   Chief Complaint: Medical Management of Chronic Issues    HPI:  1. Depression, recurrent ([place]) Is on celexa and is doing well. Depression screen [facility] 2/9 09/02/2020 03/10/2020 05/17/2019  Decreased Interest 0 0 0  Down, Depressed, Hopeless 0 0 0  PHQ - 2 Score 0 0 0  Altered sleeping - 0 -  Tired, decreased energy - 1 -  Change in appetite - 0 -  Feeling bad or failure about yourself  - 0 -  Trouble concentrating - 0 -  Moving slowly or fidgety/restless - 0 -  Suicidal thoughts - 0 -  PHQ-9 Score - 1 -  Difficult doing work/chores - Not difficult at all -     2. Primary insomnia Sleep 4-5 hours a night. She is on no meds.  3. Alopecia Is on proscar and is doing well. Has not noticed any large amount of hair loss.  4. BMI 38.0-38.9,adult Patient has been on adipex and topamax and weight has actually gone up since last visit. Wt Readings from Last 3 Encounters:  09/02/20 226 lb 3.2 oz (102.6 kg)  03/10/20 224 lb (101.6 kg)  05/17/19 213 lb (96.6 kg)   BMI Readings from Last 3 Encounters:  09/02/20 38.83 kg/m  03/10/20 38.45 kg/m  05/17/19 36.56 kg/m   5. CHronic knee pain Is on neurontin- has knee pain from her back surgery.    Outpatient Encounter Medications as of 09/02/2020  Medication Sig  . albuterol (VENTOLIN HFA) 108 (90 Base) MCG/ACT inhaler Inhale 2 puffs into the lungs every 6 (six) hours as needed for wheezing or shortness of breath.  . citalopram (CELEXA) 40 MG tablet Take 1 tablet by mouth once daily  . clindamycin (CLEOCIN-T) 1 % external solution Apply topically 2 (two) times daily.  Marland Kitchen ELDERBERRY PO Take by mouth.  . finasteride (PROSCAR) 5 MG tablet Take 1/2 (one-half) tablet by mouth once daily  . gabapentin (NEURONTIN) 100 MG capsule Take 1 capsule (100 mg total) by mouth at bedtime.  . Levocetirizine Dihydrochloride (XYZAL ALLERGY 24HR PO) Take by  mouth.  . minocycline (MINOCIN) 100 MG capsule Take 1 capsule (100 mg total) by mouth 2 (two) times daily.  . phentermine (ADIPEX-P) 37.5 MG tablet Take 1 tablet (37.5 mg total) by mouth daily before breakfast.  . topiramate (TOPAMAX) 50 MG tablet TAKE 1 TO 2 TABLETS BY MOUTH TWICE DAILY  . tretinoin (RETIN-A) 0.025 % cream Apply to the face in the evening   No facility-administered encounter medications on file as of 09/02/2020.    Past Surgical History:  Procedure Laterality Date  . CHOLECYSTECTOMY  12/23/2010   Procedure: LAPAROSCOPIC CHOLECYSTECTOMY WITH INTRAOPERATIVE CHOLANGIOGRAM;  Surgeon: Willey Blade, MD;  Location: [facility];  Service: General;  Laterality: N/A;  LAP CHOLE WITH X-RAY  . WISDOM TOOTH EXTRACTION     in oral surgeons office    Family History  Problem Relation Age of Onset  . Cancer Mother        ovarian  . Mitral valve prolapse Mother   . Cancer Maternal Grandmother        pancreatic, lymphoma    New complaints: None today  Social history: Lives with husband and children  Controlled substance contract: n/a     Review of Systems  Constitutional:  Negative for diaphoresis.  Eyes:  Negative for pain.  Respiratory:  Negative  for shortness of breath.   Cardiovascular:  Negative for chest pain, palpitations and leg swelling.  Gastrointestinal:  Negative for abdominal pain.  Endocrine: Negative for polydipsia.  Skin:  Negative for rash.  Neurological:  Negative for dizziness, weakness and headaches.  Hematological:  Does not bruise/bleed easily.  All other systems reviewed and are negative.     Objective:   Physical Exam Vitals and nursing note reviewed.  Constitutional:      General: She is not in acute distress.    Appearance: Normal appearance. She is well-developed.  HENT:     Head: Normocephalic.     Right Ear: Tympanic membrane normal.     Left Ear: Tympanic membrane normal.     Nose: Nose normal.     Mouth/Throat:     Mouth:  Mucous membranes are moist.  Eyes:     Pupils: Pupils are equal, round, and reactive to light.  Neck:     Vascular: No carotid bruit or JVD.  Cardiovascular:     Rate and Rhythm: Normal rate and regular rhythm.     Heart sounds: Normal heart sounds.  Pulmonary:     Effort: Pulmonary effort is normal. No respiratory distress.     Breath sounds: Normal breath sounds. No wheezing or rales.  Chest:     Chest wall: No tenderness.  Abdominal:     General: Bowel sounds are normal. There is no distension or abdominal bruit.     Palpations: Abdomen is soft. There is no hepatomegaly, splenomegaly, mass or pulsatile mass.     Tenderness: There is no abdominal tenderness.  Musculoskeletal:        General: Normal range of motion.     Cervical back: Normal range of motion and neck supple.  Lymphadenopathy:     Cervical: No cervical adenopathy.  Skin:    General: Skin is warm and dry.  Neurological:     Mental Status: She is alert and oriented to person, place, and time.     Deep Tendon Reflexes: Reflexes are normal and symmetric.  Psychiatric:        Behavior: Behavior normal.        Thought Content: Thought content normal.        Judgment: Judgment normal.    BP 137/89   Pulse 81   Temp 98.3 F (36.8 C) (Temporal)   Ht '5\' 4"'$  (1.626 m)   Wt 226 lb 3.2 oz (102.6 kg)   SpO2 98%   BMI 38.83 kg/m        Assessment & Plan:  Judy Olson comes in today with chief complaint of Medical Management of Chronic Issues   Diagnosis and orders addressed:  1. Depression, recurrent ([place]) Continue celexa Stress mnagement  2. Primary insomnia Bedtime routine  3. Alopecia - finasteride (PROSCAR) 5 MG tablet; Take 1/2 (one-half) tablet by mouth once daily  Dispense: 45 tablet; Refill: 1  4. BMI 38.0-38.9,adult Discussed diet and exercise for person with BMI >25 Will recheck weight in 3-6 months Appointment made with clinic pharmacist  5. Chronic pain of left knee  - gabapentin  (NEURONTIN) 100 MG capsule; Take 1 capsule (100 mg total) by mouth at bedtime.  Dispense: 90 capsule; Refill: 1   Labs pending Health Maintenance reviewed Diet and exercise encouraged  Follow up plan: 6 months   Mary-Margaret Hassell Done, FNP

## 2020-09-03 ENCOUNTER — Telehealth: Payer: Self-pay | Admitting: Pharmacist

## 2020-09-03 ENCOUNTER — Ambulatory Visit: Payer: BC Managed Care – PPO | Admitting: Pharmacist

## 2020-09-03 VITALS — Wt 226.0 lb

## 2020-09-03 DIAGNOSIS — Z6838 Body mass index (BMI) 38.0-38.9, adult: Secondary | ICD-10-CM

## 2020-09-03 DIAGNOSIS — Z713 Dietary counseling and surveillance: Secondary | ICD-10-CM

## 2020-09-03 MED ORDER — SAXENDA 18 MG/3ML ~~LOC~~ SOPN: 3.0000 mg | PEN_INJECTOR | Freq: Every day | SUBCUTANEOUS | 5 refills | Status: DC

## 2020-09-03 MED ORDER — NOVOFINE PLUS PEN NEEDLE 32G X 4 MM MISC: 2 refills | Status: DC

## 2020-09-03 NOTE — Progress Notes (Signed)
    09/03/2020 Name: Judy Olson MRN: [id number] DOB: 03-01-1975    S:  4 yoF Presents for weight loss evaluation, education, and management.  Patient was referred and last seen by Primary Care Provider on 09/02/20.  She would like to lose weight and interested in anti-obesity medication options to assist.  She is currently 226lbs. BMI 38   Insurance coverage/medication affordability: state health plan     Patient reports adherence with medications. Current medications for weight loss: n/a Has tried phentermine    She reports she has difficulty with snacking/carbs.  She is on the go and does not have time to meal prep, etc   Discussed meal planning options and Plate method for healthy eating Avoid sugary drinks and desserts Incorporate balanced protein, non starchy veggies, 1 serving of carbohydrate with each meal Increase water intake Increase physical activity as able   Goal weight is around  150-160lbs per patient      A/P:   -Healthy eating and meal planning discussed   -Increased water and exercise   -START: SAXENDA 0.'6MG'$  INTO THE SKIN ONCE DAILY FOR 1 WEEK (this is covered under state health plan with a PA) INCREASE WEEKLY IN INCREMENTS OF 0.'6MG'$ /DAY UNTIL MAINTENANCE DOSAGE OF 3 MG ONCE DAILY IS REACHED.  Patient denies history of thyroid/medullary cancer.             Work to eat low fat smaller meals to reduce side effects             Decreased carbonated beverages  Patient must lose 6% of her body weight in order to continue on saxenda after 6 months    Written patient instructions provided.  Total time in face to face counseling 25 minutes.   Regina Eck, PharmD, BCPS Clinical Pharmacist, [place]  II Phone [phone]

## 2020-09-04 NOTE — Telephone Encounter (Signed)
Approved, pharmacy notified

## 2020-10-01 DIAGNOSIS — J209 Acute bronchitis, unspecified: Secondary | ICD-10-CM

## 2020-10-02 MED ORDER — ALBUTEROL SULFATE HFA 108 (90 BASE) MCG/ACT IN AERS: 2.0000 | INHALATION_SPRAY | Freq: Four times a day (QID) | RESPIRATORY_TRACT | 1 refills | Status: DC | PRN

## 2020-10-07 ENCOUNTER — Ambulatory Visit: Payer: Self-pay | Admitting: Pharmacist

## 2020-11-11 ENCOUNTER — Ambulatory Visit (INDEPENDENT_AMBULATORY_CARE_PROVIDER_SITE_OTHER): Payer: BC Managed Care – PPO | Admitting: Pharmacist

## 2020-11-11 ENCOUNTER — Other Ambulatory Visit: Payer: Self-pay

## 2020-11-11 VITALS — Wt 214.0 lb

## 2020-11-11 DIAGNOSIS — Z713 Dietary counseling and surveillance: Secondary | ICD-10-CM

## 2020-11-11 DIAGNOSIS — Z6836 Body mass index (BMI) 36.0-36.9, adult: Secondary | ICD-10-CM

## 2020-11-11 MED ORDER — FAMOTIDINE 20 MG PO TABS: 20.0000 mg | ORAL_TABLET | Freq: Two times a day (BID) | ORAL | 3 refills | Status: DC

## 2020-11-11 NOTE — Progress Notes (Signed)
      11/19/2020 Name: Judy Olson MRN: [id number]       DOB: April 04, 1975     S:  75 yoF Presents for weight loss evaluation, education, and management.  Patient was referred and last seen by Primary Care Provider on 09/02/20.  She has is happy with her progress thus far on Saxenda for weight loss, however she is having some mild side effects.  She has lost 12-14 lbs since starting Saxenda (fluctuates daily depending on fluid, etc) currently 226lbs. BMI 38   Insurance coverage/medication affordability: state health plan     Patient reports adherence with medications. Current medications for weight loss: Saxenda 3mg  sq daily Has tried phentermine (past)   She reports she has difficulty with snacking/carbs.  She is on the go and does not have time to meal prep, etc   Discussed meal planning options and Plate method for healthy eating Avoid sugary drinks and desserts Incorporate balanced protein, non starchy veggies, 1 serving of carbohydrate with each meal Increase water intake Increase physical activity as able   Goal weight is around  150-160lbs per patient      A/P:   -Healthy eating and meal planning discussed   -Increased water and exercise   -CONTINUE: SAXENDA 3MG  INTO THE SKIN ONCE DAILY  (this is covered under state health plan with a PA)  Patient admits to increased heart burn--> samples of Pepcid AC given--she can get OTC (20mg  1-2 times daily as needed)  Recommend increase water (aim for 100 oz/day--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             Work to eat low fat smaller meals to reduce side effects             Decreased carbonated beverages             Patient must lose 6% of her body weight in order to continue on saxenda after 6 months               Written patient instructions provided.  Total time in face  to face counseling 25 minutes.   Regina Eck, PharmD, BCPS Clinical Pharmacist, [place]  II Phone [phone]

## 2020-11-18 ENCOUNTER — Other Ambulatory Visit: Payer: Self-pay | Admitting: Nurse Practitioner

## 2020-11-18 MED ORDER — IBUPROFEN 800 MG PO TABS
800.0000 mg | ORAL_TABLET | Freq: Three times a day (TID) | ORAL | 0 refills | Status: DC | PRN
Start: 2020-11-18 — End: 2021-06-23

## 2021-01-06 ENCOUNTER — Telehealth: Payer: Self-pay | Admitting: Nurse Practitioner

## 2021-01-06 NOTE — Telephone Encounter (Signed)
Your prior authorization for Judy Olson has been approved! MORE INFO For eligible patients, copay assistance may be available. To learn more and be redirected to the [place] website, click on the "More Info" button to the right. Please also note that you may need to schedule a follow-up visit with your patient prior to the expiration of this prior authorization, as updated patient weight may be required for reauthorization.  Message from plan: Your PA request has been approved. Additional information will be provided in the approval communication. (Message 1145)  WM aware

## 2021-01-06 NOTE — Telephone Encounter (Signed)
(  Key: [id number]) Saxenda 18MG /3ML pen-injectors Sent to plan

## 2021-03-02 ENCOUNTER — Other Ambulatory Visit: Payer: Self-pay | Admitting: Nurse Practitioner

## 2021-03-02 DIAGNOSIS — L659 Nonscarring hair loss, unspecified: Secondary | ICD-10-CM

## 2021-03-05 ENCOUNTER — Encounter: Payer: Self-pay | Admitting: Nurse Practitioner

## 2021-03-05 ENCOUNTER — Ambulatory Visit (INDEPENDENT_AMBULATORY_CARE_PROVIDER_SITE_OTHER): Payer: BC Managed Care – PPO | Admitting: Nurse Practitioner

## 2021-03-05 VITALS — BP 132/88 | HR 63 | Temp 97.7°F | Resp 20 | Ht 64.0 in | Wt 214.0 lb

## 2021-03-05 DIAGNOSIS — M5431 Sciatica, right side: Secondary | ICD-10-CM

## 2021-03-05 DIAGNOSIS — F339 Major depressive disorder, recurrent, unspecified: Secondary | ICD-10-CM | POA: Diagnosis not present

## 2021-03-05 DIAGNOSIS — K5901 Slow transit constipation: Secondary | ICD-10-CM | POA: Diagnosis not present

## 2021-03-05 DIAGNOSIS — Z0001 Encounter for general adult medical examination with abnormal findings: Secondary | ICD-10-CM | POA: Diagnosis not present

## 2021-03-05 DIAGNOSIS — Z0000 Encounter for general adult medical examination without abnormal findings: Secondary | ICD-10-CM

## 2021-03-05 DIAGNOSIS — L659 Nonscarring hair loss, unspecified: Secondary | ICD-10-CM

## 2021-03-05 DIAGNOSIS — M25562 Pain in left knee: Secondary | ICD-10-CM

## 2021-03-05 DIAGNOSIS — F5101 Primary insomnia: Secondary | ICD-10-CM

## 2021-03-05 DIAGNOSIS — G8929 Other chronic pain: Secondary | ICD-10-CM

## 2021-03-05 DIAGNOSIS — Z6836 Body mass index (BMI) 36.0-36.9, adult: Secondary | ICD-10-CM

## 2021-03-05 MED ORDER — METHYLPREDNISOLONE ACETATE 40 MG/ML IJ SUSP
80.0000 mg | Freq: Once | INTRAMUSCULAR | Status: AC
  Administered 2021-03-05: 80 mg via INTRAMUSCULAR

## 2021-03-05 MED ORDER — SAXENDA 18 MG/3ML ~~LOC~~ SOPN: 3.0000 mg | PEN_INJECTOR | Freq: Every day | SUBCUTANEOUS | 5 refills | Status: DC

## 2021-03-05 MED ORDER — GABAPENTIN 100 MG PO CAPS: 100.0000 mg | ORAL_CAPSULE | Freq: Every day | ORAL | 1 refills | Status: DC

## 2021-03-05 MED ORDER — FINASTERIDE 5 MG PO TABS: 5.0000 mg | ORAL_TABLET | Freq: Every day | ORAL | 1 refills | Status: DC

## 2021-03-05 MED ORDER — CITALOPRAM HYDROBROMIDE 40 MG PO TABS: 40.0000 mg | ORAL_TABLET | Freq: Every day | ORAL | 1 refills | Status: DC

## 2021-03-05 NOTE — Progress Notes (Signed)
Subjective:    Patient ID: Judy Olson, female    DOB: 1975-08-22, 46 y.o.   MRN: [id number]  Chief Complaint: Annual Exam (No pap)    HPI:  Judy Olson is a 46 y.o. who identifies as a female who was assigned female at birth.   Social history: Lives with: husband and kids Work history: [employer]   Comes in today for follow up of the following chronic medical issues:  1. Annual physical exam No PAP  2. Slow transit constipation Unchanged. She will take miralax and that helps some.  3. Primary insomnia Sleeps about 5-6 hours a night. Doe snot want to take anything else right now.  4. Depression, recurrent ([place]) Celexa and is doing well. No medication side effects. Depression screen [facility] 2/9 03/05/2021 09/02/2020 03/10/2020  Decreased Interest 0 0 0  Down, Depressed, Hopeless 0 0 0  PHQ - 2 Score 0 0 0  Altered sleeping 0 - 0  Tired, decreased energy 2 - 1  Change in appetite 0 - 0  Feeling bad or failure about yourself  0 - 0  Trouble concentrating 0 - 0  Moving slowly or fidgety/restless 0 - 0  Suicidal thoughts 0 - 0  PHQ-9 Score 2 - 1  Difficult doing work/chores Not difficult at all - Not difficult at all     5. BMI 36.0-36.9,adult Is on saxenda daily and weight is down 14 lbs  Wt Readings from Last 3 Encounters:  03/05/21 214 lb (97.1 kg)  11/11/20 214 lb (97.1 kg)  09/03/20 226 lb (102.5 kg)   BMI Readings from Last 3 Encounters:  03/05/21 36.73 kg/m  11/11/20 36.73 kg/m  09/03/20 38.79 kg/m   6. Lumbar radiculopathy Is on gabapentin BID but as only been taking daily.   New complaints: Right hip pain. Worse when laying on it. Rates pain 8/10. Pain does not radiate .  Allergies  Allergen Reactions   Hydrocodone Shortness Of Breath   Banana Other (See Comments)    Chest tightness    Outpatient Encounter Medications as of 03/05/2021  Medication Sig   albuterol (VENTOLIN HFA) 108 (90 Base) MCG/ACT inhaler Inhale 2 puffs into the  lungs every 6 (six) hours as needed for wheezing or shortness of breath.   citalopram (CELEXA) 40 MG tablet Take 1 tablet (40 mg total) by mouth daily.   clindamycin (CLEOCIN-T) 1 % external solution Apply topically 2 (two) times daily.   ELDERBERRY PO Take by mouth.   famotidine (PEPCID) 20 MG tablet Take 1 tablet (20 mg total) by mouth 2 (two) times daily.   finasteride (PROSCAR) 5 MG tablet Take 1/2 (one-half) tablet by mouth once daily  (NEEDS TO BE SEEN BEFORE NEXT REFILL)   gabapentin (NEURONTIN) 100 MG capsule Take 1 capsule (100 mg total) by mouth at bedtime.   ibuprofen (ADVIL) 800 MG tablet Take 1 tablet (800 mg total) by mouth every 8 (eight) hours as needed.   Insulin Pen Needle (NOVOFINE PLUS PEN NEEDLE) 32G X 4 MM MISC USE TO INJECT SAXENDA DAILY   Levocetirizine Dihydrochloride (XYZAL ALLERGY 24HR PO) Take by mouth.   Liraglutide -Weight Management (SAXENDA) 18 MG/3ML SOPN Inject 3 mg into the skin daily.   minocycline (MINOCIN) 100 MG capsule Take 1 capsule (100 mg total) by mouth 2 (two) times daily.   tretinoin (RETIN-A) 0.025 % cream Apply to the face in the evening   No facility-administered encounter medications on file as of 03/05/2021.  Past Surgical History:  Procedure Laterality Date   CHOLECYSTECTOMY  12/23/2010   Procedure: LAPAROSCOPIC CHOLECYSTECTOMY WITH INTRAOPERATIVE CHOLANGIOGRAM;  Surgeon: Willey Blade, MD;  Location: [facility];  Service: General;  Laterality: N/A;  LAP CHOLE WITH X-RAY   WISDOM TOOTH EXTRACTION     in oral surgeons office    Family History  Problem Relation Age of Onset   Cancer Mother        ovarian   Mitral valve prolapse Mother    Cancer Maternal Grandmother        pancreatic, lymphoma      Controlled substance contract: n/a     Review of Systems  Constitutional:  Negative for diaphoresis.  Eyes:  Negative for pain.  Respiratory:  Negative for shortness of breath.   Cardiovascular:  Negative for chest pain,  palpitations and leg swelling.  Gastrointestinal:  Negative for abdominal pain.  Endocrine: Negative for polydipsia.  Skin:  Negative for rash.  Neurological:  Negative for dizziness, weakness and headaches.  Hematological:  Does not bruise/bleed easily.  All other systems reviewed and are negative.     Objective:   Physical Exam Vitals and nursing note reviewed.  Constitutional:      General: She is not in acute distress.    Appearance: Normal appearance. She is well-developed.  HENT:     Head: Normocephalic.     Right Ear: Tympanic membrane normal.     Left Ear: Tympanic membrane normal.     Nose: Nose normal.     Mouth/Throat:     Mouth: Mucous membranes are moist.  Eyes:     Pupils: Pupils are equal, round, and reactive to light.  Neck:     Vascular: No carotid bruit or JVD.  Cardiovascular:     Rate and Rhythm: Normal rate and regular rhythm.     Heart sounds: Normal heart sounds.  Pulmonary:     Effort: Pulmonary effort is normal. No respiratory distress.     Breath sounds: Normal breath sounds. No wheezing or rales.  Chest:     Chest wall: No tenderness.  Abdominal:     General: Bowel sounds are normal. There is no distension or abdominal bruit.     Palpations: Abdomen is soft. There is no hepatomegaly, splenomegaly, mass or pulsatile mass.     Tenderness: There is no abdominal tenderness.  Musculoskeletal:        General: Normal range of motion.     Cervical back: Normal range of motion and neck supple.  Lymphadenopathy:     Cervical: No cervical adenopathy.  Skin:    General: Skin is warm and dry.  Neurological:     Mental Status: She is alert and oriented to person, place, and time.     Deep Tendon Reflexes: Reflexes are normal and symmetric.  Psychiatric:        Behavior: Behavior normal.        Thought Content: Thought content normal.        Judgment: Judgment normal.    BP 132/88    Pulse 63    Temp 97.7 F (36.5 C) (Temporal)    Resp 20    Ht 5' 4"   (1.626 m)    Wt 214 lb (97.1 kg)    SpO2 100%    BMI 36.73 kg/m        Assessment & Plan:   Judy Olson comes in today with chief complaint of Annual Exam (No pap)   Diagnosis and orders addressed:  1. Annual physical exam - CBC with Differential/Platelet - CMP14+EGFR - Lipid panel  2. Slow transit constipation Miralax daily  3. Primary insomnia Bedtime routine  4. Depression, recurrent ([facility]) Stress manaegment - citalopram (CELEXA) 40 MG tablet; Take 1 tablet (40 mg total) by mouth daily.  Dispense: 90 tablet; Refill: 1  5. BMI 36.0-36.9,adult Discussed diet and exercise for person with BMI >25 Will recheck weight in 3-6 months  - Liraglutide -Weight Management (SAXENDA) 18 MG/3ML SOPN; Inject 3 mg into the skin daily.  Dispense: 15 mL; Refill: 5  6. Sciatica of right side Moist heat - methylPREDNISolone acetate (DEPO-MEDROL) injection 80 mg  7. Alopecia - finasteride (PROSCAR) 5 MG tablet; Take 1 tablet (5 mg total) by mouth daily. Take 1/2 (one-half) tablet by mouth once daily  (NEEDS TO BE SEEN BEFORE NEXT REFILL)  Dispense: 90 tablet; Refill: 1  8. Chronic pain of left knee - gabapentin (NEURONTIN) 100 MG capsule; Take 1 capsule (100 mg total) by mouth at bedtime.  Dispense: 90 capsule; Refill: 1   Labs pending Health Maintenance reviewed Diet and exercise encouraged  Follow up plan: [place], FNP

## 2021-03-06 LAB — CMP14+EGFR
ALT: 9 IU/L (ref 0–32)
AST: 19 IU/L (ref 0–40)
Albumin/Globulin Ratio: 1.7 (ref 1.2–2.2)
Albumin: 4.5 g/dL (ref 3.8–4.8)
Alkaline Phosphatase: 85 IU/L (ref 44–121)
BUN/Creatinine Ratio: 22 (ref 9–23)
BUN: 16 mg/dL (ref 6–24)
Bilirubin Total: <0.2 mg/dL (ref 0.0–1.2)
CO2: 28 mmol/L (ref 20–29)
Calcium: 9.6 mg/dL (ref 8.7–10.2)
Chloride: 101 mmol/L (ref 96–106)
Creatinine, Ser: 0.74 mg/dL (ref 0.57–1.00)
Globulin, Total: 2.6 g/dL (ref 1.5–4.5)
Glucose: 84 mg/dL (ref 70–99)
Potassium: 3.8 mmol/L (ref 3.5–5.2)
Sodium: 141 mmol/L (ref 134–144)
Total Protein: 7.1 g/dL (ref 6.0–8.5)
eGFR: 100 mL/min/{1.73_m2} (ref >59)

## 2021-03-06 LAB — CBC WITH DIFFERENTIAL/PLATELET
Basophils Absolute: 0.1 10*3/uL (ref 0.0–0.2)
Basos: 1 %
EOS (ABSOLUTE): 0.2 10*3/uL (ref 0.0–0.4)
Eos: 3 %
Hematocrit: 39.6 % (ref 34.0–46.6)
Hemoglobin: 13.4 g/dL (ref 11.1–15.9)
Immature Grans (Abs): 0 10*3/uL (ref 0.0–0.1)
Immature Granulocytes: 0 %
Lymphocytes Absolute: 2.2 10*3/uL (ref 0.7–3.1)
Lymphs: 30 %
MCH: 28.7 pg (ref 26.6–33.0)
MCHC: 33.8 g/dL (ref 31.5–35.7)
MCV: 85 fL (ref 79–97)
Monocytes Absolute: 0.6 10*3/uL (ref 0.1–0.9)
Monocytes: 8 %
Neutrophils Absolute: 4.2 10*3/uL (ref 1.4–7.0)
Neutrophils: 58 %
Platelets: 264 10*3/uL (ref 150–450)
RBC: 4.67 x10E6/uL (ref 3.77–5.28)
RDW: 13.1 % (ref 11.7–15.4)
WBC: 7.4 10*3/uL (ref 3.4–10.8)

## 2021-03-06 LAB — LIPID PANEL
Chol/HDL Ratio: 3.7 ratio (ref 0.0–4.4)
Cholesterol, Total: 188 mg/dL (ref 100–199)
HDL: 51 mg/dL (ref >39)
LDL Chol Calc (NIH): 119 mg/dL — ABNORMAL HIGH (ref 0–99)
Triglycerides: 101 mg/dL (ref 0–149)
VLDL Cholesterol Cal: 18 mg/dL (ref 5–40)

## 2021-04-14 ENCOUNTER — Other Ambulatory Visit: Payer: Self-pay | Admitting: Nurse Practitioner

## 2021-04-17 ENCOUNTER — Ambulatory Visit: Payer: BC Managed Care – PPO | Admitting: Family

## 2021-04-17 ENCOUNTER — Ambulatory Visit: Payer: BC Managed Care – PPO | Admitting: Nurse Practitioner

## 2021-04-17 ENCOUNTER — Encounter: Payer: Self-pay | Admitting: Nurse Practitioner

## 2021-04-17 VITALS — BP 136/82 | HR 84 | Temp 97.9°F | Resp 20 | Ht 64.0 in | Wt 214.0 lb

## 2021-04-17 DIAGNOSIS — R58 Hemorrhage, not elsewhere classified: Secondary | ICD-10-CM | POA: Diagnosis not present

## 2021-04-17 DIAGNOSIS — N921 Excessive and frequent menstruation with irregular cycle: Secondary | ICD-10-CM

## 2021-04-17 MED ORDER — MEDROXYPROGESTERONE ACETATE 10 MG PO TABS: 10.0000 mg | ORAL_TABLET | Freq: Every day | ORAL | 0 refills | Status: DC

## 2021-04-17 NOTE — Progress Notes (Signed)
? ?  Subjective:  ? ? Patient ID: Judy Olson, female    DOB: [birth year], 46 y.o.   MRN: [id number] ? ?Chief Complaint: Heavy menstrual bleeding ? ? ?HPI ?Patient come sin to day c/o heavy menses. Started about 22 days ago. Bleeding so heavy that she is bleeding through super tampons and pads. This is the first period she has had in about 3 months. No cramping at all. Passing large clots. ? ? ? ?Review of Systems  ?Constitutional:  Negative for diaphoresis.  ?Eyes:  Negative for pain.  ?Respiratory:  Negative for shortness of breath.   ?Cardiovascular:  Negative for chest pain, palpitations and leg swelling.  ?Gastrointestinal:  Negative for abdominal pain.  ?Endocrine: Negative for polydipsia.  ?Skin:  Negative for rash.  ?Neurological:  Negative for dizziness, weakness and headaches.  ?Hematological:  Does not bruise/bleed easily.  ?All other systems reviewed and are negative. ? ?   ?Objective:  ? Physical Exam ?Constitutional:   ?   Appearance: Normal appearance.  ?Cardiovascular:  ?   Rate and Rhythm: Normal rate and regular rhythm.  ?   Heart sounds: Normal heart sounds.  ?Pulmonary:  ?   Effort: Pulmonary effort is normal.  ?   Breath sounds: Normal breath sounds.  ?Abdominal:  ?   General: Abdomen is flat.  ?   Palpations: Abdomen is soft.  ?Skin: ?   General: Skin is warm and dry.  ?Neurological:  ?   General: No focal deficit present.  ?   Mental Status: She is alert and oriented to person, place, and time.  ?Psychiatric:     ?   Mood and Affect: Mood normal.     ?   Behavior: Behavior normal.  ? ? ?BP 136/82   Pulse 84   Temp 97.9 ?F (36.6 ?C) (Temporal)   Resp 20   Ht '5\' 4"'$  (1.626 m)   Wt 214 lb (97.1 kg)   SpO2 100%   BMI 36.73 kg/m?  ? ? ? ? ?   ?Assessment & Plan:  ? ?Judy Olson in today with chief complaint of Heavy menstrual bleeding ? ? ?1. Blood loss ?- Hemoglobin, fingerstick ? ?2. Menorrhagia with irregular cycle ?Moist heat to belly ?Orders Placed This Encounter  ?Procedures  ?  Hemoglobin, fingerstick  ? ?Meds ordered this encounter  ?Medications  ? medroxyPROGESTERone (PROVERA) 10 MG tablet  ?  Sig: Take 1 tablet (10 mg total) by mouth daily.  ?  Dispense:  10 tablet  ?  Refill:  0  ?  Order Specific Question:   Supervising Provider  ?  Answer:   Caryl Pina A [[id number]]  ? ? ? ? ? ?The above assessment and management plan was discussed with the patient. The patient verbalized understanding of and has agreed to the management plan. Patient is aware to call the clinic if symptoms persist or worsen. Patient is aware when to return to the clinic for a follow-up visit. Patient educated on when it is appropriate to go to the emergency department.  ? ?Mary-Margaret Hassell Done, FNP ? ? ?

## 2021-04-17 NOTE — Patient Instructions (Signed)
Menorrhagia ?Menorrhagia is a form of abnormal uterine bleeding in which menstrual periods are heavy or last longer than normal. With menorrhagia, the periods may cause enough blood loss and cramping that a woman becomes unable to take part in her usual activities. ?What are the causes? ?Common causes of this condition include: ?Polyps or fibroids. These are noncancerous growths in the uterus. ?An imbalance of the hormones estrogen and progesterone. ?Anovulation, which occurs when one of the ovaries does not release an egg during one or more months. ?A problem with the thyroid gland (hypothyroidism). ?Side effects of having an intrauterine device (IUD). ?Side effects of some medicines, such as NSAIDs or blood thinners. ?A bleeding disorder that stops the blood from clotting normally. ?In some cases, the cause of this condition is not known. ?What increases the risk? ?You are more likely to develop this condition if you have cancer of the uterus. ?What are the signs or symptoms? ?Symptoms of this condition include: ?Routinely having to change your pad or tampon every 1-2 hours because it is soaked. ?Needing to use pads and tampons at the same time because of heavy bleeding. ?Needing to wake up to change your pads or tampons during the night. ?Passing blood clots larger than 1 inch (2.5 cm) in size. ?Having bleeding that lasts for more than 7 days. ?Having symptoms of low iron levels (anemia), such as tiredness (fatigue) or shortness of breath. ?How is this diagnosed? ?This condition may be diagnosed based on: ?A physical exam. ?Your symptoms and menstrual history. ?Tests, such as: ?Blood tests to check if you are pregnant or if you have hormonal changes, a bleeding or thyroid disorder, anemia, or other problems. ?Pap test to check for cancerous changes, infections, or inflammation. ?Endometrial biopsy. This test involves removing a tissue sample from the lining of the uterus (endometrium) to be examined under a  microscope. ?Pelvic ultrasound. This test uses sound waves to create images of your uterus, ovaries, and vagina. The images can show if you have fibroids or other growths. ?Hysteroscopy. For this test, a thin, flexible tube with a light on the end (hysteroscope) is used to look inside your uterus. ?How is this treated? ?Treatment may not be needed for this condition. If it is needed, the best treatment for you will depend on: ?Whether you need to prevent pregnancy. ?Your desire to have children in the future. ?The cause and severity of your bleeding. ?Your personal preference. ?Medicine ?Medicines are the first step in treatment. You may be treated with: ?Hormonal birth control methods. These treatments reduce bleeding during your menstrual period. They include: ?Birth control pills. ?Skin patch. ?Vaginal ring. ?Shots (injections) that you get every 3 months. ?Hormonal IUD. ?Implants that go under the skin. ?Medicines that thicken the blood and slow bleeding. ?Medicines that reduce swelling, such as ibuprofen. ?Medicines that contain an artificial (synthetic) hormone called progestin. ?Medicines that make the ovaries stop working for a short time. ?Iron supplements to treat anemia. ? ?Surgery ?If medicines do not work, surgery may be done. Surgical options may include: ?Dilation and curettage (D&C). In this procedure, your health care provider opens the lowest part of the uterus (cervix) and then scrapes or suctions tissue from the endometrium. This reduces menstrual bleeding. ?Operative hysteroscopy. In this procedure, a hysteroscope is used to view your uterus and help remove polyps that may be causing heavy periods. ?Endometrial ablation. This is when various techniques are used to permanently destroy your entire endometrium. After endometrial ablation, most women have little  or no menstrual flow. This procedure reduces your ability to become pregnant. ?Endometrial resection. In this procedure, an electrosurgical  wire loop is used to remove the endometrium. This procedure reduces your ability to become pregnant. ?Hysterectomy. This is surgical removal of your uterus. This is a permanent procedure that stops menstrual periods. Pregnancy is not possible after a hysterectomy. ?Follow these instructions at home: ?Medicines ?Take over-the-counter and prescription medicines only as told by your health care provider. This includes iron pills. ?Do not change or switch medicines without asking your health care provider. ?Do not take aspirin or medicines that contain aspirin 1 week before or during your menstrual period. Aspirin may make bleeding worse. ?Managing constipation ?Your iron pills may cause constipation. If you are taking prescription iron supplements, you may need to take these actions to prevent or treat constipation: ?Drink enough fluid to keep your urine pale yellow. ?Take over-the-counter or prescription medicines. ?Eat foods that are high in fiber, such as beans, whole grains, and fresh fruits and vegetables. ?Limit foods that are high in fat and processed sugars, such as fried or sweet foods. ?General instructions ?If you need to change your sanitary pad or tampon more than once every 2 hours, limit your activity until the bleeding stops. ?Eat well-balanced meals, including foods that are high in iron. Foods that have a lot of iron include leafy green vegetables, meat, liver, eggs, and whole-grain breads and cereals. ?Do not try to lose weight until the abnormal bleeding has stopped and your blood iron level is back to normal. If you need to lose weight, work with your health care provider to lose weight safely. ?Keep all follow-up visits. This is important. ?Contact a health care provider if: ?You soak through a pad or tampon every 1 or 2 hours, and this happens every time you have a period. ?You need to use pads and tampons at the same time because you are bleeding so much. ?You have nausea, vomiting, diarrhea, or  other problems related to medicines you are taking. ?Get help right away if: ?You soak through more than a pad or tampon in 1 hour. ?You pass clots bigger than 1 inch (2.5 cm) wide. ?You feel short of breath. ?You feel like your heart is beating too fast. ?You feel dizzy or you faint. ?You feel very weak or tired. ?Summary ?Menorrhagia is a form of abnormal uterine bleeding in which menstrual periods are heavy or last longer than normal. ?Treatment may not be needed for this condition. If it is needed, it may include medicines or procedures. ?Take over-the-counter and prescription medicines only as told by your health care provider. This includes iron pills. ?Get help right away if you have heavy bleeding that soaks through more than a pad or tampon in 1 hour, you pass large clots, or you feel dizzy, short of breath, or very weak or tired. ?This information is not intended to replace advice given to you by your health care provider. Make sure you discuss any questions you have with your health care provider. ?Document Revised: 10/02/2019 Document Reviewed: 10/02/2019 ?Elsevier Patient Education ? [place]. ? ?

## 2021-04-20 LAB — HEMOGLOBIN, FINGERSTICK: Hemoglobin: 11.7 g/dL (ref 11.1–15.9)

## 2021-04-23 NOTE — Telephone Encounter (Signed)
Give it till Monday and if has not stopped will do GYN referral. ?

## 2021-04-27 ENCOUNTER — Other Ambulatory Visit: Payer: Self-pay | Admitting: Nurse Practitioner

## 2021-04-27 DIAGNOSIS — N921 Excessive and frequent menstruation with irregular cycle: Secondary | ICD-10-CM

## 2021-04-27 NOTE — Telephone Encounter (Signed)
No probably need to go ahead and be seen- where would you like to go? ?

## 2021-04-27 NOTE — Progress Notes (Signed)
Ref gyn

## 2021-04-29 ENCOUNTER — Other Ambulatory Visit: Payer: Self-pay

## 2021-04-29 ENCOUNTER — Emergency Department (HOSPITAL_BASED_OUTPATIENT_CLINIC_OR_DEPARTMENT_OTHER): Payer: BC Managed Care – PPO

## 2021-04-29 ENCOUNTER — Emergency Department (HOSPITAL_BASED_OUTPATIENT_CLINIC_OR_DEPARTMENT_OTHER)
Admission: EM | Admit: 2021-04-29 | Discharge: 2021-04-29 | Disposition: A | Discharge status: Home / Self Care | Payer: BC Managed Care – PPO | Attending: Emergency Medicine | Admitting: Emergency Medicine

## 2021-04-29 ENCOUNTER — Encounter (HOSPITAL_BASED_OUTPATIENT_CLINIC_OR_DEPARTMENT_OTHER): Payer: Self-pay

## 2021-04-29 DIAGNOSIS — N938 Other specified abnormal uterine and vaginal bleeding: Secondary | ICD-10-CM | POA: Diagnosis present

## 2021-04-29 DIAGNOSIS — D259 Leiomyoma of uterus, unspecified: Secondary | ICD-10-CM | POA: Insufficient documentation

## 2021-04-29 DIAGNOSIS — N83202 Unspecified ovarian cyst, left side: Secondary | ICD-10-CM | POA: Diagnosis not present

## 2021-04-29 DIAGNOSIS — N9489 Other specified conditions associated with female genital organs and menstrual cycle: Secondary | ICD-10-CM | POA: Diagnosis not present

## 2021-04-29 DIAGNOSIS — N921 Excessive and frequent menstruation with irregular cycle: Secondary | ICD-10-CM

## 2021-04-29 DIAGNOSIS — R419 Unspecified symptoms and signs involving cognitive functions and awareness: Secondary | ICD-10-CM | POA: Insufficient documentation

## 2021-04-29 DIAGNOSIS — N939 Abnormal uterine and vaginal bleeding, unspecified: Secondary | ICD-10-CM

## 2021-04-29 DIAGNOSIS — N83201 Unspecified ovarian cyst, right side: Secondary | ICD-10-CM | POA: Diagnosis not present

## 2021-04-29 LAB — WET PREP, GENITAL
Clue Cells Wet Prep HPF POC: NONE SEEN
Sperm: NONE SEEN
Trich, Wet Prep: NONE SEEN
WBC, Wet Prep HPF POC: <10 (ref <10)
Yeast Wet Prep HPF POC: NONE SEEN

## 2021-04-29 LAB — URINALYSIS, ROUTINE W REFLEX MICROSCOPIC

## 2021-04-29 LAB — URINALYSIS, MICROSCOPIC (REFLEX)
Bacteria, UA: NONE SEEN
RBC / HPF: >50 RBC/hpf (ref 0–5)

## 2021-04-29 LAB — CBC
HCT: 32.4 % — ABNORMAL LOW (ref 36.0–46.0)
Hemoglobin: 10.6 g/dL — ABNORMAL LOW (ref 12.0–15.0)
MCH: 28.4 pg (ref 26.0–34.0)
MCHC: 32.7 g/dL (ref 30.0–36.0)
MCV: 86.9 fL (ref 80.0–100.0)
Platelets: 263 10*3/uL (ref 150–400)
RBC: 3.73 MIL/uL — ABNORMAL LOW (ref 3.87–5.11)
RDW: 14.7 % (ref 11.5–15.5)
WBC: 6.4 10*3/uL (ref 4.0–10.5)
nRBC: 0 % (ref 0.0–0.2)

## 2021-04-29 LAB — COMPREHENSIVE METABOLIC PANEL
ALT: 9 U/L (ref 0–44)
AST: 18 U/L (ref 15–41)
Albumin: 4.2 g/dL (ref 3.5–5.0)
Alkaline Phosphatase: 57 U/L (ref 38–126)
Anion gap: 6 (ref 5–15)
BUN: 17 mg/dL (ref 6–20)
CO2: 28 mmol/L (ref 22–32)
Calcium: 9 mg/dL (ref 8.9–10.3)
Chloride: 105 mmol/L (ref 98–111)
Creatinine, Ser: 0.75 mg/dL (ref 0.44–1.00)
GFR, Estimated: >60 mL/min (ref >60)
Glucose, Bld: 98 mg/dL (ref 70–99)
Potassium: 3.9 mmol/L (ref 3.5–5.1)
Sodium: 139 mmol/L (ref 135–145)
Total Bilirubin: 0.3 mg/dL (ref 0.3–1.2)
Total Protein: 7 g/dL (ref 6.5–8.1)

## 2021-04-29 LAB — HCG, SERUM, QUALITATIVE: Preg, Serum: NEGATIVE

## 2021-04-29 LAB — LIPASE, BLOOD: Lipase: 22 U/L (ref 11–51)

## 2021-04-29 MED ORDER — MEGESTROL ACETATE 20 MG PO TABS: ORAL_TABLET | ORAL | 0 refills | Status: DC

## 2021-04-29 NOTE — Consult Note (Signed)
? ?  OB/GYN Telephone Consult ? ?04/29/21 ? ?Judy Olson is a 46 y.o. not pregnant female presenting to [facility] ED. I was called for a consult regarding the care of this patient by PA at [facility] ED ? ?The provider had a clinical question about bleeding management. ? ?The provider presented the following relevant clinical information: ?Pt has been bleeding since 2/11.  Starting on provera on 2/17 by PCP ?Bleeding moderate with passage of clots.  On occasion will have one day without bleeding, but most days.  Provider relayed that [place] was completed.  Overall pt appeared stable, but seeking advice for management until she is able to be seen by OB/GYN.  She has an appointment mid-April. ? ?I performed a chart review on the patient and reviewed available documentation. ? ?BP (!) 164/98   Pulse 67   Temp 98.2 ?F (36.8 ?C)   Resp 14   Ht '5\' 4"'$  (1.626 m)   Wt 95.3 kg   SpO2 100%   BMI 36.05 kg/m?   ?Exam- performed by consulting provider ? ?I independently reviewed [place]- 11cm uterus with anterior 3.3cm fibroid.  Normal endometrium for reproductive age female.  Right ovary with 3.7cm simple cyst and left ovary with 4.9cm simple cyst- benign findings. ? ?Recommendations:  ?-Vitals stable and lab work stable- no indication for admission or transfusion ?-plan for Megace taper 3 pills x 3 days, 2 pills x 2 days then daily- total of ~ 14 days until she is able to be seen by OB/GYN ?-should she note worsening bleeding or signs of symptomatic anemia return to urgent care ? ?Thank you for this consult and if additional recommendations are needed please call [phone] for the OB/GYN attending on service at [facility].  ? ?I spent approximately 5 minutes directly consulting with the provider and verbally discussing this case. Additionally 10 minutes minutes was spent performing chart review and documentation.  ? ?Janyth Pupa, DO ?Attending [facility] [place] ? ? ? ? ? ? ?  ?

## 2021-04-29 NOTE — ED Triage Notes (Signed)
Patient here POV from Home with Vaginal Bleeding. ? ?03/14/21 she began her Menstrual Cycle but states it has not stopped since it began. Bright with Clots in [place]. ? ?Endorses Feminine Pads are becoming saturated every 1-2 hours. Due to be seen by GYN in 04/11 but referred for ED Evaluation for Assessment. Endorses Fatigue and Cramping this Week. ? ?NAD Noted during Triage. A&Ox4. GCS 15. Ambulatory. ?

## 2021-04-29 NOTE — ED Notes (Signed)
Patient transported to Ultrasound 

## 2021-04-29 NOTE — ED Notes (Signed)
Patient back from Ultrasound.

## 2021-04-29 NOTE — ED Notes (Signed)
ED Provider at bedside. 

## 2021-04-29 NOTE — Discharge Instructions (Addendum)
It was a pleasure taking care of you today! ? ?Your labs and ultrasound were unremarkable in the ED today.  Discontinue use of the Provera.  You will be sent a prescription for Megace, take as prescribed.  Maintain follow-up appointment with your GYN specialist on 05/12/2021.  You may follow-up with your primary care provider as needed.  Return to the emergency department if you are experiencing increasing/worsening vaginal bleeding or worsening symptoms. ?

## 2021-04-29 NOTE — ED Provider Notes (Signed)
?[facility] EMERGENCY DEPT ?Provider Note ? ? ?CSN: [id number] ?Arrival date & time: 04/29/21  1313 ? ?  ? ?History ? ?Chief Complaint  ?Patient presents with  ? Vaginal Bleeding  ? ? ?Judy Olson is a 46 y.o. female who presents to the Emergency Department complaining of vaginal bleeding onset 03/14/21. Her menstrual cycle has been constant and a heavy flow. Her normal periods have been irregular. For the last two years, she has had ~2 cycles per month. Wearing pad and super plus tampon. Changing pads and tampons every hour. Has an appointment with her GYN specialist on 05/12/21 with [facility]. Has associated cramping, fatigue, nausea. Also notes brain fog. Has tried advil with mild relief of her symptoms. Given provera with only one breakthrough day (hours) without bleeding. She has tried this medication for 2 weeks. Denies nausea, vomiting, dysuria, vaginal discharge. Pt is sexually active, denies concerns for STI.  ? ? ?The history is provided by the patient. No language interpreter was used.  ? ?  ? ?Home Medications ?Prior to Admission medications   ?Medication Sig Start Date End Date Taking? Authorizing Provider  ?megestrol (MEGACE) 20 MG tablet Take 3 tablets daily for 3 days, then take 2 tablets daily for 2 days, then take 1 tablet daily for 9 days. 04/29/21  Yes Marcelline Temkin A, PA-C  ?albuterol (VENTOLIN HFA) 108 (90 Base) MCG/ACT inhaler Inhale 2 puffs into the lungs every 6 (six) hours as needed for wheezing or shortness of breath. 10/02/20   Chevis Pretty, FNP  ?BD PEN NEEDLE NANO 2ND GEN 32G X 4 MM MISC USE TO INJECT SAXENDA DAILY 04/15/21   Chevis Pretty, FNP  ?citalopram (CELEXA) 40 MG tablet Take 1 tablet (40 mg total) by mouth daily. 03/05/21   Chevis Pretty, FNP  ?clindamycin (CLEOCIN-T) 1 % external solution Apply topically 2 (two) times daily. 03/10/20   Chevis Pretty, FNP  ?ELDERBERRY PO Take by mouth.    [provider]   ?famotidine (PEPCID) 20 MG tablet Take 1 tablet (20 mg total) by mouth 2 (two) times daily. 11/11/20   Chevis Pretty, FNP  ?finasteride (PROSCAR) 5 MG tablet Take 1 tablet (5 mg total) by mouth daily. Take 1/2 (one-half) tablet by mouth once daily  (NEEDS TO BE SEEN BEFORE NEXT REFILL) 03/05/21   Chevis Pretty, FNP  ?gabapentin (NEURONTIN) 100 MG capsule Take 1 capsule (100 mg total) by mouth at bedtime. 03/05/21   Hassell Done Mary-Margaret, FNP  ?ibuprofen (ADVIL) 800 MG tablet Take 1 tablet (800 mg total) by mouth every 8 (eight) hours as needed. 11/18/20   Chevis Pretty, FNP  ?Levocetirizine Dihydrochloride (XYZAL ALLERGY 24HR PO) Take by mouth.    [provider]  ?Liraglutide -Weight Management (SAXENDA) 18 MG/3ML SOPN Inject 3 mg into the skin daily. 03/05/21   Chevis Pretty, FNP  ?medroxyPROGESTERone (PROVERA) 10 MG tablet Take 1 tablet (10 mg total) by mouth daily. 04/17/21   Chevis Pretty, FNP  ?minocycline (MINOCIN) 100 MG capsule Take 1 capsule (100 mg total) by mouth 2 (two) times daily. 03/10/20   Chevis Pretty, FNP  ?tretinoin (RETIN-A) 0.025 % cream Apply to the face in the evening 03/10/20   Chevis Pretty, FNP  ?   ? ?Allergies    ?Hydrocodone and Banana   ? ?Review of Systems   ?Review of Systems  ?Constitutional:  Positive for fatigue.  ?Gastrointestinal:  Positive for abdominal pain (cramping). Negative for nausea and vomiting.  ?Genitourinary:  Positive for vaginal  bleeding. Negative for dysuria and vaginal discharge.  ? ?Physical Exam ?Updated Vital Signs ?BP (!) 165/83   Pulse 76   Temp 98.2 ?F (36.8 ?C)   Resp 16   Ht '5\' 4"'$  (1.626 m)   Wt 95.3 kg   SpO2 100%   BMI 36.05 kg/m?  ?Physical Exam ?Vitals and nursing note reviewed. Exam conducted with a chaperone present.  ?Constitutional:   ?   General: She is not in acute distress. ?   Appearance: Normal appearance. She is not ill-appearing, toxic-appearing or diaphoretic.  ?HENT:  ?    Head: Normocephalic and atraumatic.  ?   Right Ear: External ear normal.  ?   Left Ear: External ear normal.  ?   Mouth/Throat:  ?   Pharynx: No oropharyngeal exudate.  ?Eyes:  ?   General: No scleral icterus. ?   Extraocular Movements: Extraocular movements intact.  ?   Conjunctiva/sclera: Conjunctivae normal.  ?Cardiovascular:  ?   Rate and Rhythm: Normal rate and regular rhythm.  ?   Pulses: Normal pulses.  ?   Heart sounds: Normal heart sounds.  ?Pulmonary:  ?   Effort: Pulmonary effort is normal. No respiratory distress.  ?   Breath sounds: Normal breath sounds. No wheezing.  ?Abdominal:  ?   General: Abdomen is flat. Bowel sounds are normal. There is no distension.  ?   Palpations: Abdomen is soft. There is no mass.  ?   Tenderness: There is no abdominal tenderness. There is no guarding or rebound.  ?   Hernia: There is no hernia in the left inguinal area or right inguinal area.  ?Genitourinary: ?   Pubic Area: No rash.   ?   Labia:     ?   Right: No rash, tenderness, lesion or injury.     ?   Left: No rash, tenderness, lesion or injury.   ?   Vagina: No signs of injury and foreign body. Bleeding present. No vaginal discharge, erythema or tenderness.  ?   Cervix: Normal.  ?   Uterus: Normal. Not deviated, not enlarged, not fixed and not tender.   ?   Adnexa: Right adnexa normal and left adnexa normal.  ?   Comments: NT chaperone present for exam. Vaginal bleeding noted in the vaginal vault. ?Musculoskeletal:     ?   General: Normal range of motion.  ?   Cervical back: Normal range of motion and neck supple.  ?Lymphadenopathy:  ?   Lower Body: No right inguinal adenopathy. No left inguinal adenopathy.  ?Skin: ?   General: Skin is warm and dry.  ?Neurological:  ?   Mental Status: She is alert.  ?Psychiatric:     ?   Behavior: Behavior normal.  ? ? ?ED Results / Procedures / Treatments   ?Labs ?(all labs ordered are listed, but only abnormal results are displayed) ?Labs Reviewed  ?CBC - Abnormal; Notable for the  following components:  ?    Result Value  ? RBC 3.73 (*)   ? Hemoglobin 10.6 (*)   ? HCT 32.4 (*)   ? All other components within normal limits  ?URINALYSIS, ROUTINE W REFLEX MICROSCOPIC - Abnormal; Notable for the following components:  ? Color, Urine RED (*)   ? APPearance TURBID (*)   ? Glucose, UA   (*)   ? Value: TEST NOT REPORTED DUE TO COLOR INTERFERENCE OF URINE PIGMENT  ? Hgb urine dipstick   (*)   ? Value: TEST NOT  REPORTED DUE TO COLOR INTERFERENCE OF URINE PIGMENT  ? Bilirubin Urine   (*)   ? Value: TEST NOT REPORTED DUE TO COLOR INTERFERENCE OF URINE PIGMENT  ? Ketones, ur   (*)   ? Value: TEST NOT REPORTED DUE TO COLOR INTERFERENCE OF URINE PIGMENT  ? Protein, ur   (*)   ? Value: TEST NOT REPORTED DUE TO COLOR INTERFERENCE OF URINE PIGMENT  ? Nitrite   (*)   ? Value: TEST NOT REPORTED DUE TO COLOR INTERFERENCE OF URINE PIGMENT  ? Leukocytes,Ua   (*)   ? Value: TEST NOT REPORTED DUE TO COLOR INTERFERENCE OF URINE PIGMENT  ? All other components within normal limits  ?WET PREP, GENITAL  ?LIPASE, BLOOD  ?COMPREHENSIVE METABOLIC PANEL  ?HCG, SERUM, QUALITATIVE  ?URINALYSIS, MICROSCOPIC (REFLEX)  ? ? ?EKG ?None ? ?Radiology ?US PELVIC COMPLETE W TRANSVAGINAL AND TORSION R/O ? ?Result Date: 04/29/2021 ?CLINICAL DATA:  Heavy vaginal bleeding. EXAM: TRANSABDOMINAL AND TRANSVAGINAL ULTRASOUND OF PELVIS DOPPLER ULTRASOUND OF OVARIES TECHNIQUE: Both transabdominal and transvaginal ultrasound examinations of the pelvis were performed. Transabdominal technique was performed for global imaging of the pelvis including uterus, ovaries, adnexal regions, and pelvic cul-de-sac. It was necessary to proceed with endovaginal exam following the transabdominal exam to visualize the endometrium and ovaries. Color and duplex Doppler ultrasound was utilized to evaluate blood flow to the ovaries. COMPARISON:  None. FINDINGS: Uterus Measurements: 11 x 5.9 x 6.4 cm = volume: 213 mL. 3.3 x 2.7 x 3.9 cm hypoechoic anterior uterine  fundal mass consistent with a fibroid. Dystrophic calcification along the right uterine fundus. Endometrium Thickness: 11.1.  No focal abnormality visualized. Right ovary Measurements: 3.7 x 2.9 x 2.6 cm = volume: 14.7

## 2021-05-25 ENCOUNTER — Encounter: Payer: Self-pay | Admitting: Certified Nurse Midwife

## 2021-05-25 ENCOUNTER — Ambulatory Visit: Payer: BC Managed Care – PPO | Admitting: Certified Nurse Midwife

## 2021-05-25 VITALS — BP 148/98 | HR 67 | Wt 216.0 lb

## 2021-05-25 DIAGNOSIS — N924 Excessive bleeding in the premenopausal period: Secondary | ICD-10-CM

## 2021-05-25 DIAGNOSIS — N920 Excessive and frequent menstruation with regular cycle: Secondary | ICD-10-CM | POA: Insufficient documentation

## 2021-05-25 DIAGNOSIS — R635 Abnormal weight gain: Secondary | ICD-10-CM | POA: Diagnosis not present

## 2021-05-25 MED ORDER — MEGESTROL ACETATE 40 MG PO TABS: 80.0000 mg | ORAL_TABLET | Freq: Two times a day (BID) | ORAL | 5 refills | Status: DC

## 2021-05-25 NOTE — Progress Notes (Signed)
Current period of bleeding began March 14 2021. Soaking maxi pads every 1-2 hours. Reports cramping to lower abdomen. Has never had regular periods. Starting 2 years ago having period every other week; heavier than spotting, lasted 4 days. Essure for contraception. ? Judy Schneiders RN ?05/25/21 ?

## 2021-05-25 NOTE — Progress Notes (Signed)
History:  ?Judy Olson is a 46 y.o. [id number] who presents to clinic today for menorrhagia that has been going on since 03/14/21. She was seen in the hospital on 04/29/21 and sent home with megace. At the time her CBC was stable so she was advised to take oral iron. Megace decreased the bleeding but never stopped it, then she tried Provera which stopped the bleeding for a day and a half before it came back just as heavy. Currently filling a super plus tampon hourly, has to wear a pad to catch clots that slide out around the tampons. ? ?Also notes brain fog, fatigue, weight gain and worsened vision (has to wear readers now and thinks her prescription lenses are off). Has a previous history of borderline hypothyroidism. ? ?Is ready to discuss a longer term option to stop this bleeding such as ablation or hysterectomy. ? ?The following portions of the patient's history were reviewed and updated as appropriate: allergies, current medications, family history, past medical history, social history, past surgical history and problem list. ? ?Review of Systems:  ?Pertinent items noted in HPI and remainder of comprehensive ROS otherwise negative.  ?  ?Objective:  ?Physical Exam ?BP (!) 148/98   Pulse 67   Wt 216 lb (98 kg)   LMP 03/14/2021   BMI 37.08 kg/m?  ?Physical Exam ?Vitals and nursing note reviewed.  ?Constitutional:   ?   General: She is not in acute distress. ?   Appearance: Normal appearance. She is obese. She is not ill-appearing.  ?HENT:  ?   Head: Normocephalic and atraumatic.  ?   Nose: Nose normal.  ?   Mouth/Throat:  ?   Mouth: Mucous membranes are moist.  ?Eyes:  ?   Pupils: Pupils are equal, round, and reactive to light.  ?Cardiovascular:  ?   Rate and Rhythm: Normal rate and regular rhythm.  ?   Pulses: Normal pulses.  ?Pulmonary:  ?   Effort: Pulmonary effort is normal.  ?Abdominal:  ?   Palpations: Abdomen is soft.  ?Musculoskeletal:     ?   General: Normal range of motion.  ?Skin: ?   General:  Skin is warm and dry.  ?   Capillary Refill: Capillary refill takes less than 2 seconds.  ?Neurological:  ?   Mental Status: She is alert and oriented to person, place, and time.  ?Psychiatric:     ?   Mood and Affect: Mood normal.     ?   Behavior: Behavior normal.     ?   Thought Content: Thought content normal.  ? ?Labs and Imaging ?Last Hgb=10.6 ? ? ?Assessment & Plan:  ?1. Excessive bleeding in premenopausal period ?- CBC ?- [place] Sonohysterogram; Future ?- megestrol (MEGACE) 40 MG tablet; Take 2 tablets (80 mg total) by mouth 2 (two) times daily.  Dispense: 60 tablet; Refill: 5 ? ?2. Abnormal weight gain ?- Thyroid Panel With TSH ? ?Schedule sonohysterogram asap and follow up with one of our GYN surgeons. ? ?Gabriel Carina, CNM ?05/25/2021 ?8:51 PM ? ?

## 2021-05-26 LAB — CBC
Hematocrit: 35.4 % (ref 34.0–46.6)
Hemoglobin: 11.8 g/dL (ref 11.1–15.9)
MCH: 28.9 pg (ref 26.6–33.0)
MCHC: 33.3 g/dL (ref 31.5–35.7)
MCV: 87 fL (ref 79–97)
Platelets: 280 10*3/uL (ref 150–450)
RBC: 4.09 x10E6/uL (ref 3.77–5.28)
RDW: 13 % (ref 11.7–15.4)
WBC: 6.8 10*3/uL (ref 3.4–10.8)

## 2021-05-26 LAB — THYROID PANEL WITH TSH
Free Thyroxine Index: 1.4 (ref 1.2–4.9)
T3 Uptake Ratio: 27 % (ref 24–39)
T4, Total: 5.1 ug/dL (ref 4.5–12.0)
TSH: 1.32 u[IU]/mL (ref 0.450–4.500)

## 2021-06-22 ENCOUNTER — Other Ambulatory Visit: Payer: Self-pay | Admitting: Nurse Practitioner

## 2021-07-06 ENCOUNTER — Ambulatory Visit: Payer: BC Managed Care – PPO | Admitting: Obstetrics & Gynecology

## 2021-07-10 ENCOUNTER — Other Ambulatory Visit: Payer: Self-pay | Admitting: Nurse Practitioner

## 2021-07-10 ENCOUNTER — Encounter: Payer: Self-pay | Admitting: Certified Nurse Midwife

## 2021-08-27 ENCOUNTER — Other Ambulatory Visit: Payer: Self-pay | Admitting: Nurse Practitioner

## 2021-09-24 ENCOUNTER — Other Ambulatory Visit: Payer: Self-pay | Admitting: Nurse Practitioner

## 2021-09-24 DIAGNOSIS — G8929 Other chronic pain: Secondary | ICD-10-CM

## 2021-09-27 ENCOUNTER — Other Ambulatory Visit: Payer: Self-pay | Admitting: Nurse Practitioner

## 2021-09-27 DIAGNOSIS — F339 Major depressive disorder, recurrent, unspecified: Secondary | ICD-10-CM

## 2021-10-10 ENCOUNTER — Other Ambulatory Visit: Payer: Self-pay | Admitting: Nurse Practitioner

## 2021-10-10 DIAGNOSIS — J209 Acute bronchitis, unspecified: Secondary | ICD-10-CM

## 2021-10-25 ENCOUNTER — Other Ambulatory Visit: Payer: Self-pay | Admitting: Nurse Practitioner

## 2021-10-25 DIAGNOSIS — J209 Acute bronchitis, unspecified: Secondary | ICD-10-CM

## 2021-10-25 DIAGNOSIS — F339 Major depressive disorder, recurrent, unspecified: Secondary | ICD-10-CM

## 2021-10-26 MED ORDER — ALBUTEROL SULFATE HFA 108 (90 BASE) MCG/ACT IN AERS: INHALATION_SPRAY | RESPIRATORY_TRACT | 0 refills | Status: DC

## 2021-10-26 MED ORDER — CITALOPRAM HYDROBROMIDE 40 MG PO TABS: 40.0000 mg | ORAL_TABLET | Freq: Every day | ORAL | 0 refills | Status: DC

## 2021-10-26 NOTE — Addendum Note (Signed)
Addended by: Antonietta Barcelona D on: 10/26/2021 10:48 AM   Modules accepted: Orders

## 2021-10-26 NOTE — Telephone Encounter (Signed)
Pt scheduled appt for 11/17/2021, needs refill sent to pharmacy

## 2021-10-26 NOTE — Telephone Encounter (Signed)
MMM NTBS 30 days given 09/28/21

## 2021-10-30 ENCOUNTER — Ambulatory Visit: Payer: BC Managed Care – PPO | Admitting: Family Medicine

## 2021-10-30 ENCOUNTER — Encounter: Payer: Self-pay | Admitting: Family Medicine

## 2021-10-30 VITALS — BP 146/89 | HR 71 | Temp 97.4°F | Ht 64.0 in | Wt 222.4 lb

## 2021-10-30 DIAGNOSIS — J069 Acute upper respiratory infection, unspecified: Secondary | ICD-10-CM | POA: Diagnosis not present

## 2021-10-30 MED ORDER — CHLORPHEN-PE-ACETAMINOPHEN 4-10-325 MG PO TABS: 1.0000 | ORAL_TABLET | Freq: Four times a day (QID) | ORAL | 0 refills | Status: DC | PRN

## 2021-10-30 NOTE — Progress Notes (Signed)
   Acute Office Visit  Subjective:     Patient ID: Judy Olson, female    DOB: Jul 02, 1975, 46 y.o.   MRN: [id number]  Chief Complaint  Patient presents with   Cough   Nasal Congestion    HPI Upper Respiratory Infection: Patient complains of symptoms of a URI. Symptoms include congestion and cough. Onset of symptoms was 2 days ago, gradually worsening since that time. She also c/o headache, sore throat, and chills. Evaluation to date: none. Treatment to date: decongestants. She is a Pharmacist, hospital. There have been high numbers of Covid. She did have a negative home Covid test yesterday. Denies chest pain, shortness of breath, fever, ear pain, nausea, vomiting, or diarrhea.    ROS As per HPI.      Objective:    BP (!) 146/89   Pulse 71   Temp (!) 97.4 F (36.3 C) (Temporal)   Ht '5\' 4"'$  (1.626 m)   Wt 222 lb 6 oz (100.9 kg)   SpO2 99%   BMI 38.17 kg/m    Physical Exam Vitals and nursing note reviewed.  Constitutional:      General: She is not in acute distress.    Appearance: She is not ill-appearing, toxic-appearing or diaphoretic.  HENT:     Head: Normocephalic and atraumatic.     Right Ear: Tympanic membrane, ear canal and external ear normal.     Left Ear: Tympanic membrane, ear canal and external ear normal.     Nose: Congestion present.     Mouth/Throat:     Mouth: Mucous membranes are moist.     Pharynx: Oropharynx is clear. Posterior oropharyngeal erythema present. No oropharyngeal exudate.  Eyes:     General:        Right eye: No discharge.        Left eye: No discharge.  Cardiovascular:     Rate and Rhythm: Normal rate and regular rhythm.     Heart sounds: Normal heart sounds. No murmur heard. Pulmonary:     Effort: Pulmonary effort is normal. No respiratory distress.     Breath sounds: Normal breath sounds.  Skin:    General: Skin is warm and dry.  Neurological:     General: No focal deficit present.     Mental Status: She is alert and oriented to person,  place, and time.  Psychiatric:        Mood and Affect: Mood normal.        Behavior: Behavior normal.     No results found for any visits on 10/30/21.      Assessment & Plan:   Judy Olson was seen today for cough and nasal congestion.  Diagnoses and all orders for this visit:  Viral URI with cough Covid/flu/RSV pending. Quarantine pending results. Discussed quarantine if positive results. Norel AD prn, do not take other decongestant or tylenol products with norel. Discussed symptomatic care and return precautions.  -     COVID-19, Flu A+B and RSV -     Chlorphen-PE-Acetaminophen 4-10-325 MG TABS; Take 1 tablet by mouth every 6 (six) hours as needed.  Return if symptoms worsen or fail to improve.  The patient indicates understanding of these issues and agrees with the plan.  Gwenlyn Perking, FNP

## 2021-10-30 NOTE — Patient Instructions (Signed)
Viral Respiratory Infection A respiratory infection is an illness that affects part of the respiratory system, such as the lungs, nose, or throat. A respiratory infection that is caused by a virus is called a viral respiratory infection. Common types of viral respiratory infections include: A cold. The flu (influenza). A respiratory syncytial virus (RSV) infection. What are the causes? This condition is caused by a virus. The virus may spread through contact with droplets or direct contact with infected people or their mucus or secretions. The virus may spread from person to person (is contagious). What are the signs or symptoms? Symptoms of this condition include: A stuffy or runny nose. A sore throat or cough. Shortness of breath or difficulty breathing. Yellow or green mucus (sputum). Other symptoms may include: A fever. Sweating or chills. Fatigue. Achy muscles. A headache. How is this diagnosed? This condition may be diagnosed based on: Your symptoms. A physical exam. Testing of secretions from the nose or throat. Chest X-ray. How is this treated? This condition may be treated with medicines, such as: Antiviral medicine. This may shorten the length of time a person has symptoms. Expectorants. These make it easier to cough up mucus. Decongestant nasal sprays. Acetaminophen or NSAIDs, such as ibuprofen, to relieve fever and pain. Antibiotic medicines are not prescribed for viral infections.This is because antibiotics are designed to kill bacteria. They do not kill viruses. Follow these instructions at home: Managing pain and congestion Take over-the-counter and prescription medicines only as told by your health care provider. If you have a sore throat, gargle with a mixture of salt and water 3-4 times a day or as needed. To make salt water, completely dissolve -1 tsp (3-6 g) of salt in 1 cup (237 mL) of warm water. Use nose drops made from salt water to ease congestion and  soften raw skin around your nose. Take 2 tsp (10 mL) of honey at bedtime to lessen coughing at night. Do not give honey to children who are younger than 1 year. Drink enough fluid to keep your urine pale yellow. This helps prevent dehydration and helps loosen up mucus. General instructions  Rest as much as possible. Do not drink alcohol. Do not use any products that contain nicotine or tobacco. These products include cigarettes, chewing tobacco, and vaping devices, such as e-cigarettes. If you need help quitting, ask your health care provider. Keep all follow-up visits. This is important. How is this prevented?     Get an annual flu shot. You may get the flu shot in late summer, fall, or winter. Ask your health care provider when you should get your flu shot. Avoid spreading your infection to other people. If you are sick: Wash your hands with soap and water often, especially after you cough or sneeze. Wash for at least 20 seconds. If soap and water are not available, use alcohol-based hand sanitizer. Cover your mouth when you cough. Cover your nose and mouth when you sneeze. Do not share cups or eating utensils. Clean commonly used objects often. Clean commonly touched surfaces. Stay home from work or school as told by your health care provider. Avoid contact with people who are sick during cold and flu season. This is generally fall and winter. Contact a health care provider if: Your symptoms last for 10 days or longer. Your symptoms get worse over time. You have severe sinus pain in your face or forehead. The glands in your jaw or neck become very swollen. You have shortness of breath. Get   help right away if you: Feel pain or pressure in your chest. Have trouble breathing. Faint or feel like you will faint. Have severe and persistent vomiting. Feel confused or disoriented. These symptoms may represent a serious problem that is an emergency. Do not wait to see if the symptoms will  go away. Get medical help right away. Call your local emergency services (911 in the [place] Do not drive yourself to the hospital. Summary A respiratory infection is an illness that affects part of the respiratory system, such as the lungs, nose, or throat. A respiratory infection that is caused by a virus is called a viral respiratory infection. Common types of viral respiratory infections include a cold, influenza, and respiratory syncytial virus (RSV) infection. Symptoms of this condition include a stuffy or runny nose, cough, fatigue, achy muscles, sore throat, and fevers or chills. Antibiotic medicines are not prescribed for viral infections. This is because antibiotics are designed to kill bacteria. They are not effective against viruses. This information is not intended to replace advice given to you by your health care provider. Make sure you discuss any questions you have with your health care provider. Document Revised: 04/24/2020 Document Reviewed: 04/24/2020 Elsevier Patient Education  2023 Elsevier Inc.  

## 2021-11-01 LAB — COVID-19, FLU A+B AND RSV
Influenza A, NAA: NOT DETECTED
Influenza B, NAA: NOT DETECTED
RSV, NAA: NOT DETECTED
SARS-CoV-2, NAA: NOT DETECTED

## 2021-11-02 ENCOUNTER — Encounter: Payer: Self-pay | Admitting: Family Medicine

## 2021-11-04 ENCOUNTER — Encounter (INDEPENDENT_AMBULATORY_CARE_PROVIDER_SITE_OTHER): Payer: BC Managed Care – PPO | Admitting: Family Medicine

## 2021-11-17 ENCOUNTER — Encounter: Payer: Self-pay | Admitting: Nurse Practitioner

## 2021-11-17 ENCOUNTER — Ambulatory Visit: Payer: BC Managed Care – PPO | Admitting: Nurse Practitioner

## 2021-11-17 VITALS — BP 138/74 | HR 83 | Temp 97.7°F | Resp 20 | Ht 64.0 in | Wt 222.0 lb

## 2021-11-17 DIAGNOSIS — Z6836 Body mass index (BMI) 36.0-36.9, adult: Secondary | ICD-10-CM

## 2021-11-17 DIAGNOSIS — G8929 Other chronic pain: Secondary | ICD-10-CM

## 2021-11-17 DIAGNOSIS — M5416 Radiculopathy, lumbar region: Secondary | ICD-10-CM

## 2021-11-17 DIAGNOSIS — F339 Major depressive disorder, recurrent, unspecified: Secondary | ICD-10-CM | POA: Diagnosis not present

## 2021-11-17 DIAGNOSIS — K219 Gastro-esophageal reflux disease without esophagitis: Secondary | ICD-10-CM

## 2021-11-17 DIAGNOSIS — F5101 Primary insomnia: Secondary | ICD-10-CM | POA: Diagnosis not present

## 2021-11-17 DIAGNOSIS — M25562 Pain in left knee: Secondary | ICD-10-CM

## 2021-11-17 MED ORDER — SAXENDA 18 MG/3ML ~~LOC~~ SOPN: 3.0000 mg | PEN_INJECTOR | Freq: Every day | SUBCUTANEOUS | 5 refills | Status: DC

## 2021-11-17 MED ORDER — CITALOPRAM HYDROBROMIDE 40 MG PO TABS: 40.0000 mg | ORAL_TABLET | Freq: Every day | ORAL | 0 refills | Status: DC

## 2021-11-17 MED ORDER — FAMOTIDINE 20 MG PO TABS: 20.0000 mg | ORAL_TABLET | Freq: Two times a day (BID) | ORAL | 1 refills | Status: DC

## 2021-11-17 MED ORDER — GABAPENTIN 100 MG PO CAPS: 100.0000 mg | ORAL_CAPSULE | Freq: Every day | ORAL | 0 refills | Status: DC

## 2021-11-17 NOTE — Progress Notes (Signed)
Subjective:    Patient ID: Judy Olson, female    DOB: [birth year], 46 y.o.   MRN: [id number]   Chief Complaint: No chief complaint on file.    HPI:  Judy Olson is a 46 y.o. who identifies as a female who was assigned female at birth.   Social history: Lives with: husband and kid Work history: [place] in today for follow up of the following chronic medical issues:  1. Depression, recurrent ([place]) Is on celexa and seems to be working well.    11/17/2021    4:18 PM 05/25/2021    3:02 PM 04/17/2021    4:35 PM  Depression screen PHQ 2/9  Decreased Interest 0 0 0  Down, Depressed, Hopeless 0 0 0  PHQ - 2 Score 0 0 0  Altered sleeping 1 0 0  Tired, decreased energy '3 3 3  '$ Change in appetite 0 0 0  Feeling bad or failure about yourself  0 0 3  Trouble concentrating '3 3 2  '$ Moving slowly or fidgety/restless 2 0   Suicidal thoughts 0 0 0  PHQ-9 Score '9 6 8  '$ Difficult doing work/chores Somewhat difficult  Not difficult at all      11/17/2021    4:19 PM 05/25/2021    3:02 PM 04/17/2021    4:36 PM 03/05/2021    4:20 PM  GAD 7 : Generalized Anxiety Score  Nervous, Anxious, on Edge 1 0 0 0  Control/stop worrying 1 0 0 0  Worry too much - different things 1 0 0 0  Trouble relaxing 2 1 0 0  Restless 3 1 0 0  Easily annoyed or irritable 1 1 0 0  Afraid - awful might happen 0 0 0 0  Total GAD 7 Score 9 3 0 0  Anxiety Difficulty Somewhat difficult  Not difficult at all Not difficult at all      2. Primary insomnia Not sleeping well. Sleeps about 4-5 hours a night she has been on melatonin in the past .   3. Lumbar radiculopathy Neurontin daily seems to help with back and knee pain  4. BMI 36.0-36.9,adult Wt Readings from Last 3 Encounters:  11/17/21 222 lb (100.7 kg)  10/30/21 222 lb 6 oz (100.9 kg)  05/25/21 216 lb (98 kg)   BMI Readings from Last 3 Encounters:  11/17/21 38.11 kg/m  10/30/21 38.17 kg/m  05/25/21 37.08 kg/m        New complaints: None today  Allergies  Allergen Reactions   Hydrocodone Shortness Of Breath   Banana Other (See Comments)    Chest tightness    Outpatient Encounter Medications as of 11/17/2021  Medication Sig   albuterol (VENTOLIN HFA) 108 (90 Base) MCG/ACT inhaler INHALE 2 PUFFS BY MOUTH EVERY 6 HOURS AS NEEDED FOR WHEEZING OR  SHORTNESS  OF  BREATH.   BD PEN NEEDLE NANO 2ND GEN 32G X 4 MM MISC USE TO INJECT SAXENDA DAILY   Chlorphen-PE-Acetaminophen 4-10-325 MG TABS Take 1 tablet by mouth every 6 (six) hours as needed.   citalopram (CELEXA) 40 MG tablet Take 1 tablet (40 mg total) by mouth daily.   clindamycin (CLEOCIN-T) 1 % external solution Apply topically 2 (two) times daily.   ELDERBERRY PO Take by mouth.   famotidine (PEPCID) 20 MG tablet Take 1 tablet by mouth twice daily   finasteride (PROSCAR) 5 MG tablet Take 1 tablet (5 mg total) by mouth daily. Take 1/2 (one-half) tablet by mouth  once daily  (NEEDS TO BE SEEN BEFORE NEXT REFILL)   gabapentin (NEURONTIN) 100 MG capsule Take 1 capsule by mouth at bedtime   ibuprofen (ADVIL) 800 MG tablet TAKE 1 TABLET BY MOUTH EVERY 8 HOURS AS NEEDED   Levocetirizine Dihydrochloride (XYZAL ALLERGY 24HR PO) Take by mouth.   Liraglutide -Weight Management (SAXENDA) 18 MG/3ML SOPN Inject 3 mg into the skin daily.   minocycline (MINOCIN) 100 MG capsule Take 1 capsule (100 mg total) by mouth 2 (two) times daily.   tretinoin (RETIN-A) 0.025 % cream Apply to the face in the evening   No facility-administered encounter medications on file as of 11/17/2021.    Past Surgical History:  Procedure Laterality Date   CHOLECYSTECTOMY  12/23/2010   Procedure: LAPAROSCOPIC CHOLECYSTECTOMY WITH INTRAOPERATIVE CHOLANGIOGRAM;  Surgeon: Willey Blade, MD;  Location: [facility];  Service: General;  Laterality: N/A;  LAP CHOLE WITH X-RAY   WISDOM TOOTH EXTRACTION     in oral surgeons office    Family History  Problem Relation Age of Onset    Cancer Mother        ovarian   Mitral valve prolapse Mother    Cancer Maternal Grandmother        pancreatic, lymphoma      Controlled substance contract: n/a     Review of Systems  Constitutional:  Negative for diaphoresis.  Eyes:  Negative for pain.  Respiratory:  Negative for shortness of breath.   Cardiovascular:  Negative for chest pain, palpitations and leg swelling.  Gastrointestinal:  Negative for abdominal pain.  Endocrine: Negative for polydipsia.  Skin:  Negative for rash.  Neurological:  Negative for dizziness, weakness and headaches.  Hematological:  Does not bruise/bleed easily.  All other systems reviewed and are negative.      Objective:   Physical Exam Vitals and nursing note reviewed.  Constitutional:      General: She is not in acute distress.    Appearance: Normal appearance. She is well-developed.  HENT:     Head: Normocephalic.     Right Ear: Tympanic membrane normal.     Left Ear: Tympanic membrane normal.     Nose: Nose normal.     Mouth/Throat:     Mouth: Mucous membranes are moist.  Eyes:     Pupils: Pupils are equal, round, and reactive to light.  Neck:     Vascular: No carotid bruit or JVD.  Cardiovascular:     Rate and Rhythm: Normal rate and regular rhythm.     Heart sounds: Normal heart sounds.  Pulmonary:     Effort: Pulmonary effort is normal. No respiratory distress.     Breath sounds: Normal breath sounds. No wheezing or rales.  Chest:     Chest wall: No tenderness.  Abdominal:     General: Bowel sounds are normal. There is no distension or abdominal bruit.     Palpations: Abdomen is soft. There is no hepatomegaly, splenomegaly, mass or pulsatile mass.     Tenderness: There is no abdominal tenderness.  Musculoskeletal:        General: Normal range of motion.     Cervical back: Normal range of motion and neck supple.  Lymphadenopathy:     Cervical: No cervical adenopathy.  Skin:    General: Skin is warm and dry.   Neurological:     Mental Status: She is alert and oriented to person, place, and time.     Deep Tendon Reflexes: Reflexes are normal and symmetric.  Psychiatric:        Behavior: Behavior normal.        Thought Content: Thought content normal.        Judgment: Judgment normal.    BP 138/74   Pulse 83   Temp 97.7 F (36.5 C) (Temporal)   Resp 20   Ht '5\' 4"'$  (1.626 m)   Wt 222 lb (100.7 kg)   SpO2 100%   BMI 38.11 kg/m         Assessment & Plan:  Shabnam Ladd comes in today with chief complaint of Medical Management of Chronic Issues   Diagnosis and orders addressed:  1. Depression, recurrent ([place]) Stress management - citalopram (CELEXA) 40 MG tablet; Take 1 tablet (40 mg total) by mouth daily.  Dispense: 30 tablet; Refill: 0  2. Primary insomnia Bedtime routine  3. Lumbar radiculopathy Continue neurontin  4. BMI 36.0-36.9,adult Discussed diet and exercise for person with BMI >25 Will recheck weight in 3-6 months - Liraglutide -Weight Management (SAXENDA) 18 MG/3ML SOPN; Inject 3 mg into the skin daily.  Dispense: 15 mL; Refill: 5  5. Chronic pain of left knee - gabapentin (NEURONTIN) 100 MG capsule; Take 1 capsule (100 mg total) by mouth at bedtime.  Dispense: 90 capsule; Refill: 0  6. Gastroesophageal reflux disease without esophagitis Avoid spicy foods Do not eat 2 hours prior to bedtime  - famotidine (PEPCID) 20 MG tablet; Take 1 tablet (20 mg total) by mouth 2 (two) times daily.  Dispense: 180 tablet; Refill: 1   Labs pending Health Maintenance reviewed Diet and exercise encouraged  Follow up plan: 6 months   Mary-Margaret Hassell Done, FNP

## 2021-11-17 NOTE — Patient Instructions (Signed)
Alopecia Areata, Adult  Alopecia areata is a condition that causes hair loss. A person with this condition may lose hair on the scalp in patches. In some cases, a person may lose all the hair on the scalp or all the hair from the face and body. Having this condition can be emotionally difficult, but it is not dangerous. Alopecia areata is an autoimmune disease. This means that your body's defense system (immune system) mistakes normal parts of the body for germs or other things that can make you sick. When you have alopecia areata, the immune system attacks the hair follicles. What are the causes? The cause of this condition is not known. What increases the risk? You are more likely to develop this condition if you have: A family history of alopecia. A family history of another autoimmune disease, including type 1 diabetes and thyroid autoimmune disease. Eczema, asthma, and allergies. Down syndrome. What are the signs or symptoms? The main symptom of this condition is round spots of patchy hair loss on the scalp. The spots may be mildly itchy. Other symptoms include: Short dark hairs in the bald patches that are wider at the top (exclamation point hairs). Dents, white spots, or lines in the fingernails or toenails. Balding and body hair loss. This is rare. Alopecia areata usually develops in childhood, but it can develop at any age. For some people, their hair grows back on its own and hair loss does not happen again. For others, their hair may fall out and grow back in cycles. The hair loss may last many years. How is this diagnosed? This condition is diagnosed based on your symptoms and family history. Your health care provider will also check your scalp skin, teeth, and nails. Your health care provider may refer you to a specialist in hair and skin disorders (dermatologist). You may also have tests, including: A hair pull test. Blood tests or other screening tests to check for autoimmune  diseases, such as thyroid disease or diabetes. Skin biopsy to confirm the diagnosis. A procedure to examine the skin with a lighted magnifying instrument (dermoscopy). How is this treated? There is no cure for alopecia areata. The goals of treatment are to promote the regrowth of hair and prevent the immune system from overreacting. No single treatment is right for all people with alopecia areata. It depends on the type of hair loss you have and how severe it is. Work with your health care provider to find the best treatment for you. Treatment may include: Regular checkups to make sure the condition is not getting worse . This is called watchful waiting. Using steroid creams or pills for 6-8 weeks to stop the immune reaction and help hair to regrow more quickly. Using other medicines on your skin (topical medicines) to change the immune system response and support the hair growth cycle. Steroid injections. Therapy and counseling with a support group or therapist if you are having trouble coping with hair loss. Follow these instructions at home: Medicines Apply topical creams only as told by your health care provider. Take over-the-counter and prescription medicines only as told by your health care provider. General instructions Learn as much as you can about your condition. Consider getting a wig or products to make hair look fuller or to cover bald spots, if you feel uncomfortable with your appearance. Get therapy or counseling if you are having a hard time coping with hair loss. Ask your health care provider to recommend a counselor or support group. Keep   all follow-up visits as told by your health care provider. This is important. Where to find more information National Alopecia Areata Foundation: naaf.org Contact a health care provider if: Your hair loss gets worse, even with treatment. You have new symptoms. You are struggling emotionally. Get help right away if: You have a sudden  worsening of the hair loss. Summary Alopecia areata is an autoimmune condition that makes your body's defense system (immune system) attack the hair follicles. This causes you to lose hair. Having this condition can be emotionally difficult, but it is not dangerous. Treatments may include regular checkups to make sure that the condition is not getting worse, medicines, and steroid injections. This information is not intended to replace advice given to you by your health care provider. Make sure you discuss any questions you have with your health care provider. Document Revised: 04/03/2019 Document Reviewed: 04/03/2019 Elsevier Patient Education  2023 Elsevier Inc.  

## 2021-12-30 ENCOUNTER — Ambulatory Visit (INDEPENDENT_AMBULATORY_CARE_PROVIDER_SITE_OTHER): Payer: BC Managed Care – PPO | Admitting: Bariatrics

## 2021-12-30 ENCOUNTER — Encounter: Payer: Self-pay | Admitting: Bariatrics

## 2021-12-30 VITALS — BP 144/78 | HR 72 | Temp 98.0°F | Ht 64.0 in | Wt 224.0 lb

## 2021-12-30 DIAGNOSIS — Z1331 Encounter for screening for depression: Secondary | ICD-10-CM

## 2021-12-30 DIAGNOSIS — Z6838 Body mass index (BMI) 38.0-38.9, adult: Secondary | ICD-10-CM | POA: Insufficient documentation

## 2021-12-30 DIAGNOSIS — R0602 Shortness of breath: Secondary | ICD-10-CM | POA: Insufficient documentation

## 2021-12-30 DIAGNOSIS — K219 Gastro-esophageal reflux disease without esophagitis: Secondary | ICD-10-CM

## 2021-12-30 DIAGNOSIS — R030 Elevated blood-pressure reading, without diagnosis of hypertension: Secondary | ICD-10-CM | POA: Insufficient documentation

## 2021-12-30 DIAGNOSIS — K5901 Slow transit constipation: Secondary | ICD-10-CM | POA: Diagnosis not present

## 2021-12-30 DIAGNOSIS — M25562 Pain in left knee: Secondary | ICD-10-CM

## 2021-12-30 DIAGNOSIS — R5383 Other fatigue: Secondary | ICD-10-CM | POA: Insufficient documentation

## 2021-12-30 DIAGNOSIS — E559 Vitamin D deficiency, unspecified: Secondary | ICD-10-CM

## 2021-12-30 DIAGNOSIS — Z0000 Encounter for general adult medical examination without abnormal findings: Secondary | ICD-10-CM

## 2021-12-30 DIAGNOSIS — G8929 Other chronic pain: Secondary | ICD-10-CM

## 2021-12-30 DIAGNOSIS — E669 Obesity, unspecified: Secondary | ICD-10-CM

## 2021-12-30 DIAGNOSIS — R7309 Other abnormal glucose: Secondary | ICD-10-CM

## 2021-12-30 DIAGNOSIS — G4709 Other insomnia: Secondary | ICD-10-CM

## 2021-12-30 DIAGNOSIS — Z0289 Encounter for other administrative examinations: Secondary | ICD-10-CM

## 2022-01-02 LAB — COMPREHENSIVE METABOLIC PANEL
ALT: 9 IU/L (ref 0–32)
AST: 19 IU/L (ref 0–40)
Albumin/Globulin Ratio: 1.5 (ref 1.2–2.2)
Albumin: 4 g/dL (ref 3.9–4.9)
Alkaline Phosphatase: 82 IU/L (ref 44–121)
BUN/Creatinine Ratio: 25 — ABNORMAL HIGH (ref 9–23)
BUN: 18 mg/dL (ref 6–24)
Bilirubin Total: 0.4 mg/dL (ref 0.0–1.2)
CO2: 24 mmol/L (ref 20–29)
Calcium: 8.9 mg/dL (ref 8.7–10.2)
Chloride: 102 mmol/L (ref 96–106)
Creatinine, Ser: 0.73 mg/dL (ref 0.57–1.00)
Globulin, Total: 2.6 g/dL (ref 1.5–4.5)
Glucose: 84 mg/dL (ref 70–99)
Potassium: 4.2 mmol/L (ref 3.5–5.2)
Sodium: 138 mmol/L (ref 134–144)
Total Protein: 6.6 g/dL (ref 6.0–8.5)
eGFR: 102 mL/min/{1.73_m2} (ref >59)

## 2022-01-02 LAB — TSH+T4F+T3FREE
Free T4: 0.94 ng/dL (ref 0.82–1.77)
T3, Free: 2.8 pg/mL (ref 2.0–4.4)
TSH: 1.39 u[IU]/mL (ref 0.450–4.500)

## 2022-01-02 LAB — LIPID PANEL WITH LDL/HDL RATIO
Cholesterol, Total: 230 mg/dL — ABNORMAL HIGH (ref 100–199)
HDL: 58 mg/dL (ref >39)
LDL Chol Calc (NIH): 158 mg/dL — ABNORMAL HIGH (ref 0–99)
LDL/HDL Ratio: 2.7 ratio (ref 0.0–3.2)
Triglycerides: 82 mg/dL (ref 0–149)
VLDL Cholesterol Cal: 14 mg/dL (ref 5–40)

## 2022-01-02 LAB — HEMOGLOBIN A1C
Est. average glucose Bld gHb Est-mCnc: 117 mg/dL
Hgb A1c MFr Bld: 5.7 % — ABNORMAL HIGH (ref 4.8–5.6)

## 2022-01-02 LAB — INSULIN, RANDOM: INSULIN: 11.1 u[IU]/mL (ref 2.6–24.9)

## 2022-01-02 LAB — VITAMIN D 25 HYDROXY (VIT D DEFICIENCY, FRACTURES): Vit D, 25-Hydroxy: 27.2 ng/mL — ABNORMAL LOW (ref 30.0–100.0)

## 2022-01-04 ENCOUNTER — Encounter (INDEPENDENT_AMBULATORY_CARE_PROVIDER_SITE_OTHER): Payer: Self-pay | Admitting: Bariatrics

## 2022-01-04 DIAGNOSIS — E7800 Pure hypercholesterolemia, unspecified: Secondary | ICD-10-CM | POA: Insufficient documentation

## 2022-01-04 DIAGNOSIS — R7303 Prediabetes: Secondary | ICD-10-CM | POA: Insufficient documentation

## 2022-01-06 ENCOUNTER — Telehealth: Payer: Self-pay

## 2022-01-06 ENCOUNTER — Other Ambulatory Visit (HOSPITAL_COMMUNITY): Payer: Self-pay

## 2022-01-06 NOTE — Telephone Encounter (Signed)
PA renewal initiated automatically by CoverMyMeds.  Submitted a Prior Authorization request to [employer] for Saxenda '18MG'$ /3ML pen-injectors via CoverMyMeds. Will update once we receive a response.   Key: [id number]

## 2022-01-07 ENCOUNTER — Other Ambulatory Visit (HOSPITAL_COMMUNITY): Payer: Self-pay

## 2022-01-07 NOTE — Telephone Encounter (Signed)
Pharmacy Patient Advocate Encounter  Prior Authorization for Judy Olson has been approved.     Effective dates: 01/06/2022 through 01/07/2023

## 2022-01-09 ENCOUNTER — Other Ambulatory Visit: Payer: Self-pay | Admitting: Nurse Practitioner

## 2022-01-09 DIAGNOSIS — F339 Major depressive disorder, recurrent, unspecified: Secondary | ICD-10-CM

## 2022-01-11 ENCOUNTER — Other Ambulatory Visit: Payer: Self-pay | Admitting: Nurse Practitioner

## 2022-01-11 DIAGNOSIS — F339 Major depressive disorder, recurrent, unspecified: Secondary | ICD-10-CM

## 2022-01-11 MED ORDER — CITALOPRAM HYDROBROMIDE 40 MG PO TABS: 40.0000 mg | ORAL_TABLET | Freq: Every day | ORAL | 0 refills | Status: DC

## 2022-01-11 NOTE — Telephone Encounter (Signed)
From: [name] To: Office of [facility], [place] Sent: 01/09/2022 9:15 AM EST Subject: Medication Renewal Request  Refills have been requested for the following medications:   citalopram (CELEXA) 40 MG tablet [[name]]  Preferred pharmacy: [facility] [place], [place] - [address] Delivery method: [employer]

## 2022-01-13 NOTE — Progress Notes (Signed)
Chief Complaint:   OBESITY Judy Olson (MR# [id number]) is a 46 y.o. female who presents for evaluation and treatment of obesity and related comorbidities. Current BMI is Body mass index is 38.45 kg/m. Judy Olson has been struggling with her weight for many years and has been unsuccessful in either losing weight, maintaining weight loss, or reaching her healthy weight goal.  Judy Olson is currently in the action stage of change and ready to dedicate time achieving and maintaining a healthier weight. Judy Olson is interested in becoming our patient and working on intensive lifestyle modifications including (but not limited to) diet and exercise for weight loss.  Judy Olson states that she heard about [place] on the radio announcements.    Judy Olson's habits were reviewed today and are as follows: Her family eats meals together, she thinks her family will eat healthier with her, her desired weight loss is 79 lbs, she started gaining weight after her first child (at 38 years old), her heaviest weight ever was 230 pounds, she has significant food cravings issues, she skips meals frequently, she is frequently drinking liquids with calories, she frequently makes poor food choices, she frequently eats larger portions than normal, and she struggles with emotional eating.  Depression Screen Judy Olson Food and Mood (modified PHQ-9) score was 22.  Subjective:   1. Other fatigue Aftan admits to daytime somnolence and admits to waking up still tired. Patient has a history of symptoms of daytime fatigue, morning fatigue, and morning headache. Judy Olson generally gets 4 or 6 hours of sleep per night, and states that she has nightime awakenings. Snoring is not present. Apneic episodes are not present. Epworth Sleepiness Score is 8.   2. SOB (shortness of breath) on exertion Analiese notes increasing shortness of breath with exercising and seems to be worsening over time with weight gain. She notes getting out of breath sooner with  activity than she used to. This has not gotten worse recently. Pamala denies shortness of breath at rest or orthopnea.  3. Chronic pain of left knee Shastina is taking gabapentin. She is walking occasionally.   4. Slow transit constipation Judy Olson is taking miralax and 15 days colon cleanser.   5. Gastroesophageal reflux disease without esophagitis Judy Olson had been on Saxenda. She is taking Pepcid.    6. Health care maintenance Given obesity.   7. Elevated blood pressure reading Judy Olson blood pressure is 144/78.  8. Elevated glucose Judy Olson is not on medications currently.   9. Vitamin D deficiency Judy Olson is not on medications currently, but she is using Elderberry.   10. Other insomnia Judy Olson notes insomnia.   Assessment/Plan:   1. Other fatigue Judy Olson does feel that her weight is causing her energy to be lower than it should be. Fatigue may be related to obesity, depression or many other causes. Labs will be ordered, and in the meanwhile, Hoa will focus on self care including making healthy food choices, increasing physical activity and focusing on stress reduction.  - EKG 12-Lead - Comprehensive metabolic panel - [id number]  2. SOB (shortness of breath) on exertion Judy Olson does feel that she gets out of breath more easily that she used to when she exercises. Judy Olson shortness of breath appears to be obesity related and exercise induced. She has agreed to work on weight loss and gradually increase exercise to treat her exercise induced shortness of breath. Will continue to monitor closely.  - TSH+T4F+T3Free  3. Chronic pain of left knee Judy Olson will continue her medications as directed.   -  Comprehensive metabolic panel  4. Slow transit constipation Judy Olson was advised that increasing protein may cause symptoms to worsen.   5. Gastroesophageal reflux disease without esophagitis Judy Olson will watch for triggers. May resume Saxenda (has a supply at home). GLP-1 handout was given.    6. Health care maintenance We will check labs today. EKG and IC were done, and results were reviewed with the patient.   - Comprehensive metabolic panel - Lipid Panel With LDL/HDL Ratio  7. Elevated blood pressure reading We will continue to monitor her blood pressure over time.   8. Elevated glucose We will check labs today, and we will follow-up at [place] next visit.   - Hemoglobin A1c - Insulin, random  9. Vitamin D deficiency We will check labs today, and we will follow-up at [place] next visit.   - VITAMIN D 25 Hydroxy (Vit-D Deficiency, Fractures)  10. Other insomnia Sleep hygiene was discussed with the patient, and handout was given today.   11. Depression screening Judy Olson had a positive depression screening. Depression is commonly associated with obesity and often results in emotional eating behaviors. We will monitor this closely and work on CBT to help improve the non-hunger eating patterns. Referral to Psychology may be required if no improvement is seen as she continues in our clinic.  12. Obesity, Current BMI 38.4 Judy Olson is currently in the action stage of change and her goal is to continue with weight loss efforts. I recommend Judy Olson begin the structured treatment plan as follows:  She has agreed to the Category 2 Plan + 100 calories and keeping a food journal and adhering to recommended goals of 1300 calories and 80 grams of protein.  Reviewed labs with the patient from 04/29/2021, CMP, CBC, and glucose.   Exercise goals: No exercise has been prescribed at this time.   Behavioral modification strategies: increasing lean protein intake, decreasing simple carbohydrates, increasing vegetables, increasing water intake, decreasing eating out, no skipping meals, meal planning and cooking strategies, keeping healthy foods in the home, and planning for success.  She was informed of the importance of frequent follow-up visits to maximize her success with intensive  lifestyle modifications for her multiple health conditions. She was informed we would discuss her lab results at her next visit unless there is a critical issue that needs to be addressed sooner. Judy Olson agreed to keep her next visit at the agreed upon time to discuss these results.  Objective:   Blood pressure (!) 144/78, pulse 72, temperature 98 F (36.7 C), height '5\' 4"'$  (1.626 m), weight 224 lb (101.6 kg), SpO2 98 %. Body mass index is 38.45 kg/m.  EKG: Normal sinus rhythm, rate 70 BPM.  Indirect Calorimeter completed today shows a VO2 of 250 and a REE of 1728.  Her calculated basal metabolic rate is 3762 thus her basal metabolic rate is better than expected.  General: Cooperative, alert, well developed, in no acute distress. HEENT: Conjunctivae and lids unremarkable. Cardiovascular: Regular rhythm.  Lungs: Normal work of breathing. Neurologic: No focal deficits.   Lab Results  Component Value Date   CREATININE 0.73 12/30/2021   BUN 18 12/30/2021   NA 138 12/30/2021   K 4.2 12/30/2021   CL 102 12/30/2021   CO2 24 12/30/2021   Lab Results  Component Value Date   ALT 9 12/30/2021   AST 19 12/30/2021   ALKPHOS 82 12/30/2021   BILITOT 0.4 12/30/2021   Lab Results  Component Value Date   HGBA1C 5.7 (H) 12/30/2021  HGBA1C 5.6 10/31/2015   Lab Results  Component Value Date   INSULIN 11.1 12/30/2021   Lab Results  Component Value Date   TSH 1.390 12/30/2021   Lab Results  Component Value Date   CHOL 230 (H) 12/30/2021   HDL 58 12/30/2021   LDLCALC 158 (H) 12/30/2021   TRIG 82 12/30/2021   CHOLHDL 3.7 03/05/2021   Lab Results  Component Value Date   WBC 6.8 05/25/2021   HGB 11.8 05/25/2021   HCT 35.4 05/25/2021   MCV 87 05/25/2021   PLT 280 05/25/2021   No results found for: "IRON", "TIBC", "FERRITIN"  Attestation Statements:   Reviewed by clinician on day of visit: allergies, medications, problem list, medical history, surgical history, family history,  social history, and previous encounter notes.   Wilhemena Durie, am acting as Location manager for [employer], DO.  I have reviewed the above documentation for accuracy and completeness, and I agree with the above. Jearld Lesch, DO

## 2022-01-14 ENCOUNTER — Ambulatory Visit: Payer: BC Managed Care – PPO | Admitting: Bariatrics

## 2022-01-14 ENCOUNTER — Encounter: Payer: Self-pay | Admitting: Bariatrics

## 2022-01-14 VITALS — BP 147/83 | HR 64 | Temp 97.8°F | Ht 64.0 in | Wt 220.0 lb

## 2022-01-14 DIAGNOSIS — E559 Vitamin D deficiency, unspecified: Secondary | ICD-10-CM | POA: Diagnosis not present

## 2022-01-14 DIAGNOSIS — R7303 Prediabetes: Secondary | ICD-10-CM

## 2022-01-14 DIAGNOSIS — E7800 Pure hypercholesterolemia, unspecified: Secondary | ICD-10-CM

## 2022-01-14 DIAGNOSIS — E669 Obesity, unspecified: Secondary | ICD-10-CM | POA: Diagnosis not present

## 2022-01-14 DIAGNOSIS — Z6837 Body mass index (BMI) 37.0-37.9, adult: Secondary | ICD-10-CM

## 2022-01-14 MED ORDER — VITAMIN D (ERGOCALCIFEROL) 1.25 MG (50000 UNIT) PO CAPS: 50000.0000 [IU] | ORAL_CAPSULE | ORAL | 0 refills | Status: DC

## 2022-01-18 ENCOUNTER — Encounter: Payer: Self-pay | Admitting: Bariatrics

## 2022-01-26 NOTE — Progress Notes (Signed)
Chief Complaint:   OBESITY Judy Olson is here to discuss her progress with her obesity treatment plan along with follow-up of her obesity related diagnoses. Judy Olson is on the Category 2 Plan + 100 calories and keeping a food journal and adhering to recommended goals of 1300 calories and 80 grams of protein and states she is following her eating plan approximately 80% of the time. Judy Olson states she is doing 0 minutes 0 times per week.  Today's visit was #: 2 Starting weight: 224 lbs Starting date: 12/30/2021 Today's weight: 220 lbs Today's date: 01/14/2022 Total lbs lost to date: 4 Total lbs lost since last in-office visit: 4  Interim History: Judy Olson is down 4 pounds since her last visit.  She denies any triggers.  She has avoided sweets.  Subjective:   1. Vitamin D insufficiency Judy Olson is taking multivitamins, and her recent vitamin D level was 27.2.  2. Prediabetes Judy Olson is taking Saxenda 0.6 mg.  Her recent A1c was 5.7 and insulin 11.1.  3. Elevated cholesterol Judy Olson is taking multivitamins but no other medications.  Her total cholesterol was 230, and her LDL was 158.  Assessment/Plan:   1. Vitamin D insufficiency Judy Olson agreed to start prescription vitamin D 50,000 IU every week, with no refills.  - Vitamin D, Ergocalciferol, (DRISDOL) 1.25 MG (50000 UNIT) CAPS capsule; Take 1 capsule (50,000 Units total) by mouth every 7 (seven) days.  Dispense: 5 capsule; Refill: 0  2. Prediabetes Judy Olson will continue Saxenda at 0.6 mg once daily.   3. Elevated cholesterol Handout was given on healthy fats versus unhealthy fats.  Fish oil (EPA) and omega-3 ([place]) were discussed.  Eating out handout was given.  4. Obesity, Current BMI 37.9 Judy Olson is currently in the action stage of change. As such, her goal is to continue with weight loss efforts. She has agreed to the Category 2 Plan + 100 calories and keeping a food journal and adhering to recommended goals of 1300 calories and 80 grams of  protein.   Intentional eating was discussed.  Labs were reviewed from 12/30/2021, CMP, lipids, vitamin D, insulin, thyroid panel, and A1c.  She will adhere to the plan and increase her water intake.  She is to use the app to track her meals.  Exercise goals: No exercise has been prescribed at this time.  Behavioral modification strategies: increasing lean protein intake, decreasing simple carbohydrates, increasing vegetables, increasing water intake, decreasing eating out, no skipping meals, meal planning and cooking strategies, keeping healthy foods in the home, and planning for success.  Judy Olson has agreed to follow-up with our clinic in 2 weeks with Everardo Pacific, FNP-C. She was informed of the importance of frequent follow-up visits to maximize her success with intensive lifestyle modifications for her multiple health conditions.   Objective:   Blood pressure (!) 147/83, pulse 64, temperature 97.8 F (36.6 C), height '5\' 4"'$  (1.626 m), weight 220 lb (99.8 kg), SpO2 99 %. Body mass index is 37.76 kg/m.  General: Cooperative, alert, well developed, in no acute distress. HEENT: Conjunctivae and lids unremarkable. Cardiovascular: Regular rhythm.  Lungs: Normal work of breathing. Neurologic: No focal deficits.   Lab Results  Component Value Date   CREATININE 0.73 12/30/2021   BUN 18 12/30/2021   NA 138 12/30/2021   K 4.2 12/30/2021   CL 102 12/30/2021   CO2 24 12/30/2021   Lab Results  Component Value Date   ALT 9 12/30/2021   AST 19 12/30/2021   ALKPHOS 82  12/30/2021   BILITOT 0.4 12/30/2021   Lab Results  Component Value Date   HGBA1C 5.7 (H) 12/30/2021   HGBA1C 5.6 10/31/2015   Lab Results  Component Value Date   INSULIN 11.1 12/30/2021   Lab Results  Component Value Date   TSH 1.390 12/30/2021   Lab Results  Component Value Date   CHOL 230 (H) 12/30/2021   HDL 58 12/30/2021   LDLCALC 158 (H) 12/30/2021   TRIG 82 12/30/2021   CHOLHDL 3.7 03/05/2021    Lab Results  Component Value Date   VD25OH 27.2 (L) 12/30/2021   Lab Results  Component Value Date   WBC 6.8 05/25/2021   HGB 11.8 05/25/2021   HCT 35.4 05/25/2021   MCV 87 05/25/2021   PLT 280 05/25/2021   No results found for: "IRON", "TIBC", "FERRITIN"  Attestation Statements:   Reviewed by clinician on day of visit: allergies, medications, problem list, medical history, surgical history, family history, social history, and previous encounter notes.   Wilhemena Durie, am acting as Location manager for [employer], DO.  I have reviewed the above documentation for accuracy and completeness, and I agree with the above. Jearld Lesch, DO

## 2022-01-28 ENCOUNTER — Ambulatory Visit: Payer: BC Managed Care – PPO | Admitting: Nurse Practitioner

## 2022-01-28 ENCOUNTER — Encounter: Payer: Self-pay | Admitting: Nurse Practitioner

## 2022-01-28 VITALS — BP 138/89 | HR 75 | Temp 98.6°F | Ht 64.0 in | Wt 225.0 lb

## 2022-01-28 DIAGNOSIS — E669 Obesity, unspecified: Secondary | ICD-10-CM

## 2022-01-28 DIAGNOSIS — Z6838 Body mass index (BMI) 38.0-38.9, adult: Secondary | ICD-10-CM | POA: Diagnosis not present

## 2022-01-28 DIAGNOSIS — K5903 Drug induced constipation: Secondary | ICD-10-CM | POA: Diagnosis not present

## 2022-01-28 DIAGNOSIS — R5383 Other fatigue: Secondary | ICD-10-CM | POA: Diagnosis not present

## 2022-01-28 DIAGNOSIS — T383X5A Adverse effect of insulin and oral hypoglycemic [antidiabetic] drugs, initial encounter: Secondary | ICD-10-CM

## 2022-01-28 MED ORDER — DOCUSATE SODIUM 100 MG PO CAPS: 100.0000 mg | ORAL_CAPSULE | Freq: Two times a day (BID) | ORAL | 0 refills | Status: AC

## 2022-02-04 ENCOUNTER — Encounter: Payer: BC Managed Care – PPO | Admitting: Nurse Practitioner

## 2022-02-10 ENCOUNTER — Ambulatory Visit: Payer: BC Managed Care – PPO | Admitting: Bariatrics

## 2022-02-12 NOTE — Progress Notes (Unsigned)
Chief Complaint:   OBESITY Judy Olson is here to discuss her progress with her obesity treatment plan along with follow-up of her obesity related diagnoses. [name] is on the Category 2 Plan and states she is following her eating plan approximately 15% of the time. Fredrika states she is exercising 0 minutes 0 times per week.  Today's visit was #: 3 Starting weight: 224 lbs Starting date: 12/30/2021 Today's weight: 225 lbs Today's date: 01/28/2022 Total lbs lost to date: 0 lbs  Total lbs lost since last in-office visit: 0  Interim History: [name] she is taking Saxenda 0.6 mg. Struggles with meeting all her calories and protein goals with taking Saxenda. Took Saxenda in the past. Notes side effects of GERD and constipation.  Subjective:   1. Drug-induced constipation [name] has struggled with constipation her entire life but has gotten worse with Saxenda. She tried Miralax and Linzess-stopped due to side effects.  2. Other fatigue More noticeable over the past of couple of months. History: anemia due to menses. Started over the counter multivitamin over the past month. Not taking iron. Unsure of history of Vit B12 def. Does have a Vit D def, currently taking Vit D.  Assessment/Plan:   1. Drug-induced constipation Continue/Refill colace 100 mg twice a day as needed for 1 month with 0 refills. Continue Miralax this weekend as needed. Side effects discussed. Increase water, movement and veggies. Encouraged scheduling a colonoscopy.  -Refill docusate sodium (COLACE) 100 MG capsule; Take 1 capsule (100 mg total) by mouth 2 (two) times daily.  Dispense: 10 capsule; Refill: 0  2. Other fatigue Offered labs. Will continue to monitor.  3. Obesity, Current BMI 38.8 [name] is currently in the action stage of change. As such, her goal is to continue with weight loss efforts. She has agreed to keeping a food journal and adhering to recommended goals of 1200 calories and 75+ grams of protein.    Change Saxenda to evening/night and see how she does. Eat 5-6 smaller meals daily.  Exercise goals: All adults should avoid inactivity. Some physical activity is better than none, and adults who participate in any amount of physical activity gain some health benefits.  Behavioral modification strategies: increasing lean protein intake, increasing water intake, and meal planning and cooking strategies.  [name] has agreed to follow-up with our clinic in 2 weeks. She was informed of the importance of frequent follow-up visits to maximize her success with intensive lifestyle modifications for her multiple health conditions.   Objective:   Blood pressure 138/89, pulse 75, temperature 98.6 F (37 C), temperature source Oral, height '5\' 4"'$  (1.626 m), weight 225 lb (102.1 kg), SpO2 99 %. Body mass index is 38.62 kg/m.  General: Cooperative, alert, well developed, in no acute distress. HEENT: Conjunctivae and lids unremarkable. Cardiovascular: Regular rhythm.  Lungs: Normal work of breathing. Neurologic: No focal deficits.   Lab Results  Component Value Date   CREATININE 0.73 12/30/2021   BUN 18 12/30/2021   NA 138 12/30/2021   K 4.2 12/30/2021   CL 102 12/30/2021   CO2 24 12/30/2021   Lab Results  Component Value Date   ALT 9 12/30/2021   AST 19 12/30/2021   ALKPHOS 82 12/30/2021   BILITOT 0.4 12/30/2021   Lab Results  Component Value Date   HGBA1C 5.7 (H) 12/30/2021   HGBA1C 5.6 10/31/2015   Lab Results  Component Value Date   INSULIN 11.1 12/30/2021   Lab Results  Component Value Date  TSH 1.390 12/30/2021   Lab Results  Component Value Date   CHOL 230 (H) 12/30/2021   HDL 58 12/30/2021   LDLCALC 158 (H) 12/30/2021   TRIG 82 12/30/2021   CHOLHDL 3.7 03/05/2021   Lab Results  Component Value Date   VD25OH 27.2 (L) 12/30/2021   Lab Results  Component Value Date   WBC 6.8 05/25/2021   HGB 11.8 05/25/2021   HCT 35.4 05/25/2021   MCV 87 05/25/2021   PLT 280  05/25/2021   No results found for: "IRON", "TIBC", "FERRITIN"  Attestation Statements:   Reviewed by clinician on day of visit: allergies, medications, problem list, medical history, surgical history, family history, social history, and previous encounter notes.  I, Brendell Tyus, RMA, am acting as transcriptionist for Everardo Pacific, FNP.  I have reviewed the above documentation for accuracy and completeness, and I agree with the above. Everardo Pacific, FNP

## 2022-02-19 ENCOUNTER — Ambulatory Visit: Payer: BC Managed Care – PPO | Admitting: Family

## 2022-02-19 ENCOUNTER — Encounter: Payer: Self-pay | Admitting: Nurse Practitioner

## 2022-02-19 ENCOUNTER — Encounter: Payer: Self-pay | Admitting: Family

## 2022-02-19 VITALS — BP 131/85 | HR 75 | Temp 97.9°F | Ht 64.0 in | Wt 223.0 lb

## 2022-02-19 DIAGNOSIS — J019 Acute sinusitis, unspecified: Secondary | ICD-10-CM

## 2022-02-19 MED ORDER — AMOXICILLIN-POT CLAVULANATE 875-125 MG PO TABS: 1.0000 | ORAL_TABLET | Freq: Two times a day (BID) | ORAL | 0 refills | Status: DC

## 2022-02-19 NOTE — Progress Notes (Signed)
Subjective:    Patient ID: Judy Olson, female    DOB: Jul 01, 1975, 47 y.o.   MRN: [id number]  Chief Complaint  Patient presents with   Sinus Problem    Patient thinks is a sinuse infection has facial pressure since Monday      Sinus Problem This is a new problem. The current episode started in the past 7 days. The problem has been gradually worsening since onset. There has been no fever. Her pain is at a severity of 4/10. The pain is moderate. Associated symptoms include chills, congestion, ear pain (left), headaches, a hoarse voice, shortness of breath, sinus pressure, sneezing and swollen glands. Pertinent negatives include no coughing or sore throat. Past treatments include oral decongestants and acetaminophen. The treatment provided mild relief.      Review of Systems  Constitutional:  Positive for chills.  HENT:  Positive for congestion, ear pain (left), hoarse voice, sinus pressure and sneezing. Negative for sore throat.   Respiratory:  Positive for shortness of breath. Negative for cough.   Neurological:  Positive for headaches.  All other systems reviewed and are negative.      Objective:   Physical Exam Vitals reviewed.  Constitutional:      General: She is not in acute distress.    Appearance: She is well-developed.  HENT:     Head: Normocephalic and atraumatic.     Right Ear: Tympanic membrane normal.     Left Ear: Tympanic membrane normal.     Nose:     Right Sinus: Maxillary sinus tenderness present.     Left Sinus: Maxillary sinus tenderness present.  Eyes:     Pupils: Pupils are equal, round, and reactive to light.  Neck:     Thyroid: No thyromegaly.  Cardiovascular:     Rate and Rhythm: Normal rate and regular rhythm.     Heart sounds: Normal heart sounds. No murmur heard. Pulmonary:     Effort: Pulmonary effort is normal. No respiratory distress.     Breath sounds: Normal breath sounds. No wheezing.  Abdominal:     General: Bowel sounds are  normal. There is no distension.     Palpations: Abdomen is soft.     Tenderness: There is no abdominal tenderness.  Musculoskeletal:        General: No tenderness. Normal range of motion.     Cervical back: Normal range of motion and neck supple.  Skin:    General: Skin is warm and dry.  Neurological:     Mental Status: She is alert and oriented to person, place, and time.     Cranial Nerves: No cranial nerve deficit.     Deep Tendon Reflexes: Reflexes are normal and symmetric.  Psychiatric:        Behavior: Behavior normal.        Thought Content: Thought content normal.        Judgment: Judgment normal.          BP 131/85   Pulse 75   Temp 97.9 F (36.6 C) (Temporal)   Ht '5\' 4"'$  (1.626 m)   Wt 223 lb (101.2 kg)   SpO2 98%   BMI 38.28 kg/m   Assessment & Plan:  Judy Olson comes in today with chief complaint of Sinus Problem (Patient thinks is a sinuse infection has facial pressure since Monday  )   Diagnosis and orders addressed:  1. Acute sinusitis, recurrence not specified, unspecified location - Take meds as prescribed - Use a  cool mist humidifier  -Use saline nose sprays frequently -Force fluids -For any cough or congestion  Use plain Mucinex- regular strength or max strength is fine -For fever or aces or pains- take tylenol or ibuprofen. -Throat lozenges if help -Follow up if symptoms worsen or do not improve  - amoxicillin-clavulanate (AUGMENTIN) 875-125 MG tablet; Take 1 tablet by mouth 2 (two) times daily.  Dispense: 14 tablet; Refill: 0   Evelina Dun, FNP

## 2022-02-19 NOTE — Patient Instructions (Signed)

## 2022-03-31 ENCOUNTER — Other Ambulatory Visit: Payer: Self-pay | Admitting: Nurse Practitioner

## 2022-03-31 DIAGNOSIS — G8929 Other chronic pain: Secondary | ICD-10-CM

## 2022-04-04 ENCOUNTER — Other Ambulatory Visit: Payer: Self-pay | Admitting: Nurse Practitioner

## 2022-04-04 DIAGNOSIS — L659 Nonscarring hair loss, unspecified: Secondary | ICD-10-CM

## 2022-04-20 ENCOUNTER — Encounter: Payer: Self-pay | Admitting: Nurse Practitioner

## 2022-04-20 ENCOUNTER — Ambulatory Visit: Payer: BC Managed Care – PPO | Admitting: Nurse Practitioner

## 2022-04-20 VITALS — BP 143/94 | HR 68 | Temp 97.6°F | Resp 20 | Ht 64.8 in | Wt 228.4 lb

## 2022-04-20 DIAGNOSIS — K219 Gastro-esophageal reflux disease without esophagitis: Secondary | ICD-10-CM

## 2022-04-20 DIAGNOSIS — F339 Major depressive disorder, recurrent, unspecified: Secondary | ICD-10-CM | POA: Diagnosis not present

## 2022-04-20 DIAGNOSIS — F5101 Primary insomnia: Secondary | ICD-10-CM

## 2022-04-20 DIAGNOSIS — L659 Nonscarring hair loss, unspecified: Secondary | ICD-10-CM

## 2022-04-20 DIAGNOSIS — E559 Vitamin D deficiency, unspecified: Secondary | ICD-10-CM

## 2022-04-20 MED ORDER — CITALOPRAM HYDROBROMIDE 40 MG PO TABS: 40.0000 mg | ORAL_TABLET | Freq: Every day | ORAL | 1 refills | Status: DC

## 2022-04-20 MED ORDER — BUPROPION HCL ER (XL) 150 MG PO TB24: 150.0000 mg | ORAL_TABLET | Freq: Every day | ORAL | 1 refills | Status: DC

## 2022-04-20 MED ORDER — FINASTERIDE 5 MG PO TABS: 5.0000 mg | ORAL_TABLET | Freq: Every day | ORAL | 1 refills | Status: DC

## 2022-04-20 NOTE — Addendum Note (Signed)
Addended by: Chevis Pretty on: 04/20/2022 04:33 PM   Modules accepted: Orders

## 2022-04-20 NOTE — Progress Notes (Signed)
Subjective:    Patient ID: Judy Olson, female    DOB: 09/17/1975, 47 y.o.   MRN: [id number]   Chief Complaint: medical management of chronic issues     HPI:  Judy Olson is a 47 y.o. who identifies as a female who was assigned female at birth.   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 Gastroesophageal reflux disease without esophagitis Takes pepcid n an as needed basis  2. Depression, recurrent ([place]) Is on celexa and is doing well.    04/20/2022    4:06 PM 02/19/2022   10:29 AM 11/17/2021    4:18 PM  Depression screen PHQ 2/9  Decreased Interest 1 0 0  Down, Depressed, Hopeless 1 0 0  PHQ - 2 Score 2 0 0  Altered sleeping 1 1 1   Tired, decreased energy 2 2 3   Change in appetite 2 0 0  Feeling bad or failure about yourself  0 0 0  Trouble concentrating 2 2 3   Moving slowly or fidgety/restless 0 0 2  Suicidal thoughts 0 0 0  PHQ-9 Score 9 5 9   Difficult doing work/chores Not difficult at all Somewhat difficult Somewhat difficult     3. Primary insomnia Sleeps about 5-6 hours a night. She takes tylenol PM which does help when she takes it.  4. Vitamin D deficiency Is on daily vitamin d supplement  5. BMI 36.0-36.9,adult Was on wegovy until insuance decided to no longer pay for meds wight is up 5lbs Wt Readings from Last 3 Encounters:  04/20/22 228 lb 6.4 oz (103.6 kg)  02/19/22 223 lb (101.2 kg)  01/28/22 225 lb (102.1 kg)   BMI Readings from Last 3 Encounters:  04/20/22 38.24 kg/m  02/19/22 38.28 kg/m  01/28/22 38.62 kg/m     New complaints: None today othee then anxiety  Allergies  Allergen Reactions   Hydrocodone Shortness Of Breath   Banana Other (See Comments)    Chest tightness    Outpatient Encounter Medications as of 04/20/2022  Medication Sig   albuterol (VENTOLIN HFA) 108 (90 Base) MCG/ACT inhaler INHALE 2 PUFFS BY  MOUTH EVERY 6 HOURS AS NEEDED FOR WHEEZING OR  SHORTNESS  OF  BREATH.   amoxicillin-clavulanate (AUGMENTIN) 875-125 MG tablet Take 1 tablet by mouth 2 (two) times daily.   BD PEN NEEDLE NANO 2ND GEN 32G X 4 MM MISC USE TO INJECT SAXENDA DAILY   citalopram (CELEXA) 40 MG tablet Take 1 tablet (40 mg total) by mouth daily.   clindamycin (CLEOCIN-T) 1 % external solution Apply topically 2 (two) times daily.   docusate sodium (COLACE) 100 MG capsule Take 1 capsule (100 mg total) by mouth 2 (two) times daily.   ELDERBERRY PO Take by mouth.   famotidine (PEPCID) 20 MG tablet Take 1 tablet (20 mg total) by mouth 2 (two) times daily.   finasteride (PROSCAR) 5 MG tablet Take 1 tablet (5 mg total) by mouth daily. Take 1/2 (one-half) tablet by mouth once daily  (NEEDS TO BE SEEN BEFORE NEXT REFILL)   gabapentin (NEURONTIN) 100 MG capsule Take 1 capsule (100 mg total) by mouth at bedtime.   ibuprofen (ADVIL) 800 MG tablet TAKE 1 TABLET BY MOUTH EVERY 8 HOURS AS NEEDED   Levocetirizine Dihydrochloride (XYZAL ALLERGY 24HR PO) Take by mouth.   Liraglutide -Weight Management (SAXENDA) 18 MG/3ML SOPN Inject 3 mg into the skin daily.  tretinoin (RETIN-A) 0.025 % cream Apply to the face in the evening   Vitamin D, Ergocalciferol, (DRISDOL) 1.25 MG (50000 UNIT) CAPS capsule Take 1 capsule (50,000 Units total) by mouth every 7 (seven) days.   No facility-administered encounter medications on file as of 04/20/2022.    Past Surgical History:  Procedure Laterality Date   CHOLECYSTECTOMY  12/23/2010   Procedure: LAPAROSCOPIC CHOLECYSTECTOMY WITH INTRAOPERATIVE CHOLANGIOGRAM;  Surgeon: Willey Blade, MD;  Location: [facility];  Service: General;  Laterality: N/A;  LAP CHOLE WITH X-RAY   WISDOM TOOTH EXTRACTION     in oral surgeons office    Family History  Problem Relation Age of Onset   Cancer Mother        ovarian   Mitral valve prolapse Mother    Depression Mother    Bipolar disorder Mother     Schizophrenia Mother    Drug abuse Mother    Eating disorder Mother    Obesity Mother    Cancer Father    Cancer Maternal Grandmother        pancreatic, lymphoma      Controlled substance contract: n/a     Review of Systems  Constitutional:  Negative for diaphoresis.  Eyes:  Negative for pain.  Respiratory:  Negative for shortness of breath.   Cardiovascular:  Negative for chest pain, palpitations and leg swelling.  Gastrointestinal:  Negative for abdominal pain.  Endocrine: Negative for polydipsia.  Skin:  Negative for rash.  Neurological:  Negative for dizziness, weakness and headaches.  Hematological:  Does not bruise/bleed easily.  All other systems reviewed and are negative.      Objective:   Physical Exam Vitals and nursing note reviewed.  Constitutional:      General: She is not in acute distress.    Appearance: Normal appearance. She is well-developed.  Neck:     Vascular: No carotid bruit or JVD.  Cardiovascular:     Rate and Rhythm: Normal rate and regular rhythm.     Heart sounds: Normal heart sounds.  Pulmonary:     Effort: Pulmonary effort is normal. No respiratory distress.     Breath sounds: Normal breath sounds. No wheezing or rales.  Chest:     Chest wall: No tenderness.  Abdominal:     General: Bowel sounds are normal. There is no distension or abdominal bruit.     Palpations: Abdomen is soft. There is no hepatomegaly, splenomegaly, mass or pulsatile mass.     Tenderness: There is no abdominal tenderness.  Musculoskeletal:        General: Normal range of motion.     Cervical back: Normal range of motion and neck supple.  Lymphadenopathy:     Cervical: No cervical adenopathy.  Skin:    General: Skin is warm and dry.  Neurological:     Mental Status: She is alert and oriented to person, place, and time.     Deep Tendon Reflexes: Reflexes are normal and symmetric.  Psychiatric:        Behavior: Behavior normal.        Thought Content:  Thought content normal.        Judgment: Judgment normal.    BP (!) 143/94   Pulse 68   Temp 97.6 F (36.4 C) (Temporal)   Resp 20   Ht 5' 4.8" (1.646 m)   Wt 228 lb 6.4 oz (103.6 kg)   SpO2 98%   BMI 38.24 kg/m  Assessment & Plan:  Judy Olson comes in today with chief complaint of Medical Management of Chronic Issues   Diagnosis and orders addressed:  1. Gastroesophageal reflux disease without esophagitis Avoid spicy foods Do not eat 2 hours prior to bedtime   2. Depression, recurrent ([place]) Added wellbutrin Stress management - buPROPion (WELLBUTRIN XL) 150 MG 24 hr tablet; Take 1 tablet (150 mg total) by mouth daily.  Dispense: 90 tablet; Refill: 1 - citalopram (CELEXA) 40 MG tablet; Take 1 tablet (40 mg total) by mouth daily.  Dispense: 90 tablet; Refill: 1  3. Primary insomnia Bedtime routine  4. Vitamin D deficiency Continue vitamin d supplement   Labs pending Health Maintenance reviewed Diet and exercise encouraged  Follow up plan: 3 months   Mary-Margaret Hassell Done, FNP

## 2022-04-21 ENCOUNTER — Telehealth: Payer: Self-pay | Admitting: *Deleted

## 2022-04-21 DIAGNOSIS — L659 Nonscarring hair loss, unspecified: Secondary | ICD-10-CM

## 2022-04-21 MED ORDER — FINASTERIDE 5 MG PO TABS: 5.0000 mg | ORAL_TABLET | Freq: Every day | ORAL | 1 refills | Status: DC

## 2022-04-21 NOTE — Telephone Encounter (Signed)
Fax from [facility]: Finasteride (Proscar) 5 mg Directions: 1 QD. Take 1/2 QD Please clarify and send new sctipt

## 2022-04-22 ENCOUNTER — Other Ambulatory Visit: Payer: Self-pay | Admitting: Nurse Practitioner

## 2022-04-22 DIAGNOSIS — K5909 Other constipation: Secondary | ICD-10-CM

## 2022-04-22 MED ORDER — LINACLOTIDE 72 MCG PO CAPS: 72.0000 ug | ORAL_CAPSULE | Freq: Every day | ORAL | 3 refills | Status: DC

## 2022-04-22 NOTE — Progress Notes (Signed)
Meds ordered this encounter  Medications   linaclotide (LINZESS) 72 MCG capsule    Sig: Take 1 capsule (72 mcg total) by mouth daily before breakfast.    Dispense:  30 capsule    Refill:  3    Order Specific Question:   Supervising Provider    Answer:   Worthy Rancher [id number]   [place], FNP

## 2022-04-26 DIAGNOSIS — L700 Acne vulgaris: Secondary | ICD-10-CM

## 2022-04-26 MED ORDER — TRETINOIN 0.025 % EX CREA: TOPICAL_CREAM | CUTANEOUS | 11 refills | Status: AC

## 2022-05-08 IMAGING — US US PELVIS COMPLETE TRANSABD/TRANSVAG W DUPLEX AND/OR DOPPLER
1 series · 13 of 25 positions shown · non-contrast
Comparison: None.

CLINICAL DATA: Heavy vaginal bleeding.

EXAM:
TRANSABDOMINAL AND TRANSVAGINAL ULTRASOUND OF PELVIS
DOPPLER ULTRASOUND OF OVARIES
TECHNIQUE: Both transabdominal and transvaginal ultrasound examinations of the
pelvis were performed. Transabdominal technique was performed for
global imaging of the pelvis including uterus, ovaries, adnexal
regions, and pelvic cul-de-sac.
It was necessary to proceed with endovaginal exam following the
transabdominal exam to visualize the endometrium and ovaries. Color
and duplex Doppler ultrasound was utilized to evaluate blood flow to
the ovaries.

[Series 1: us pelvic complete w transvaginal and torsion righ · 91 acquisitions, 13 frames shown]
[im 1/91]
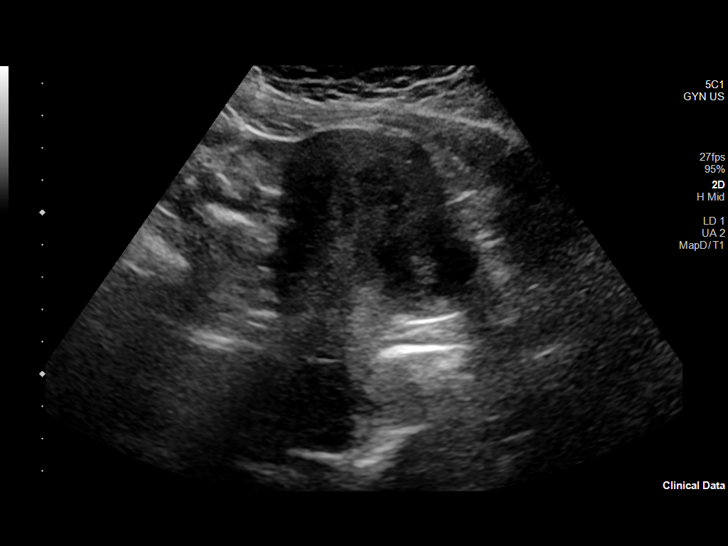
[im 8/91]
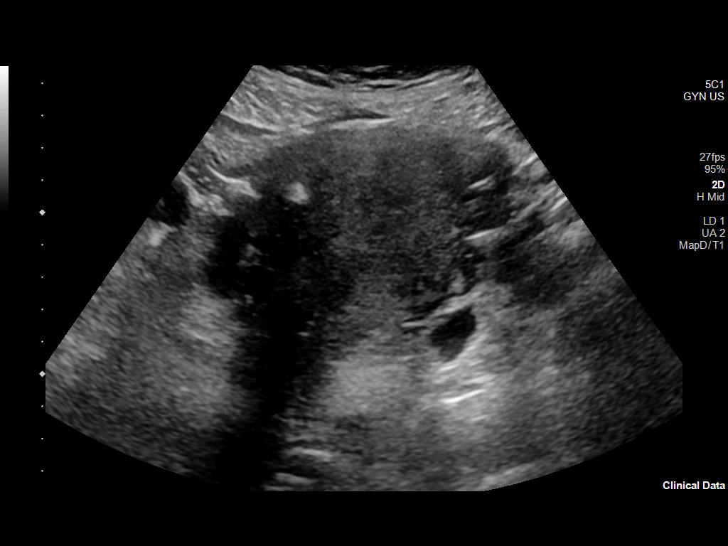
[im 16/91]
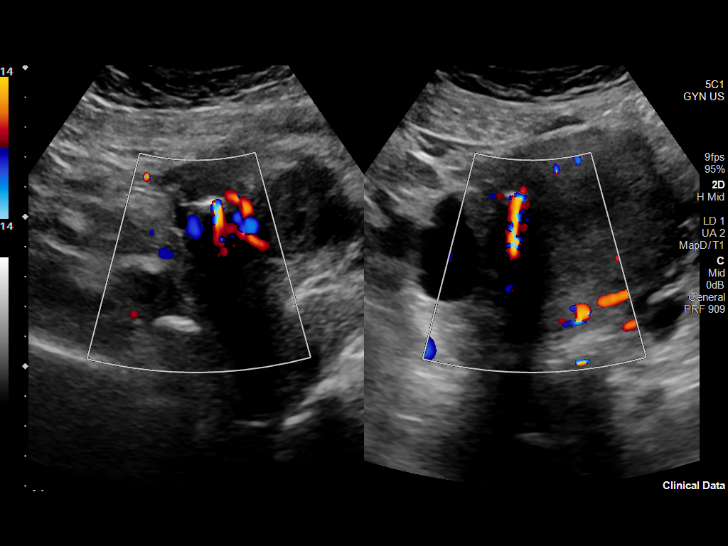
[im 23/91]
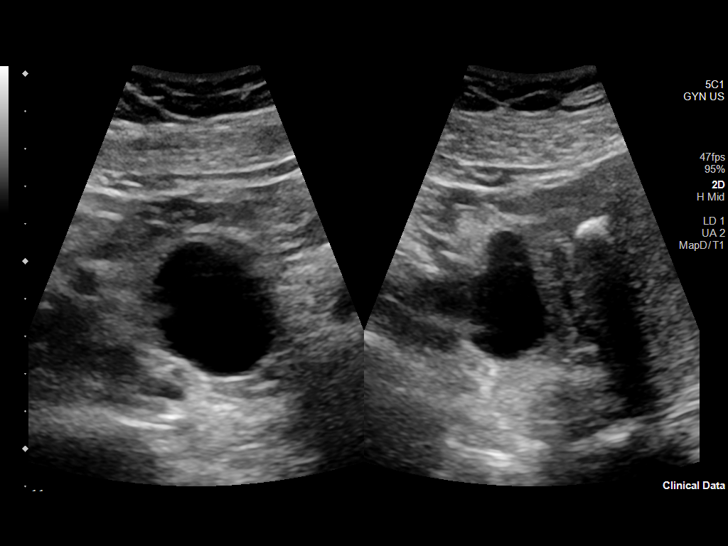
[im 31/91]
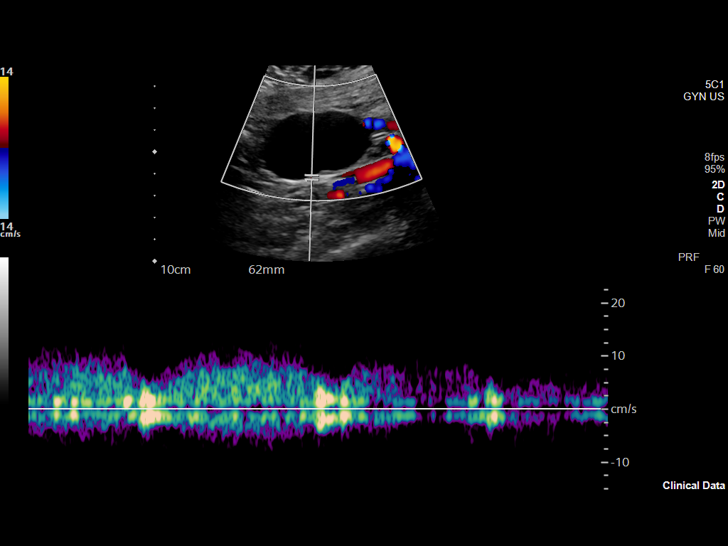
[im 38/91]
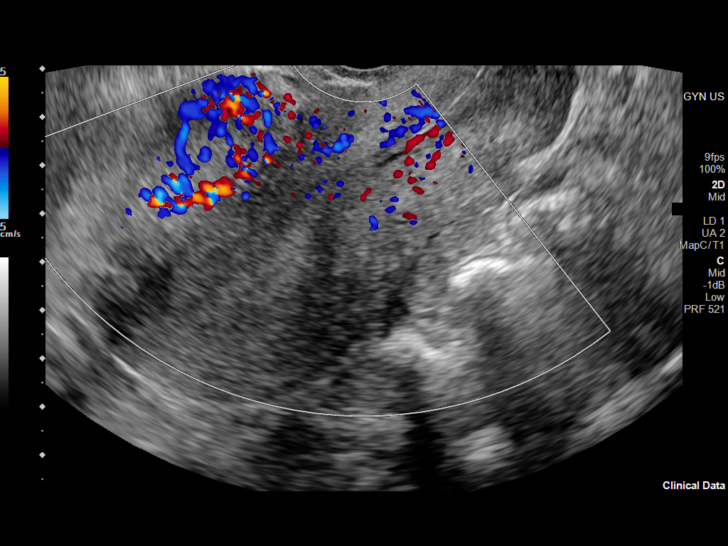
[im 46/91]
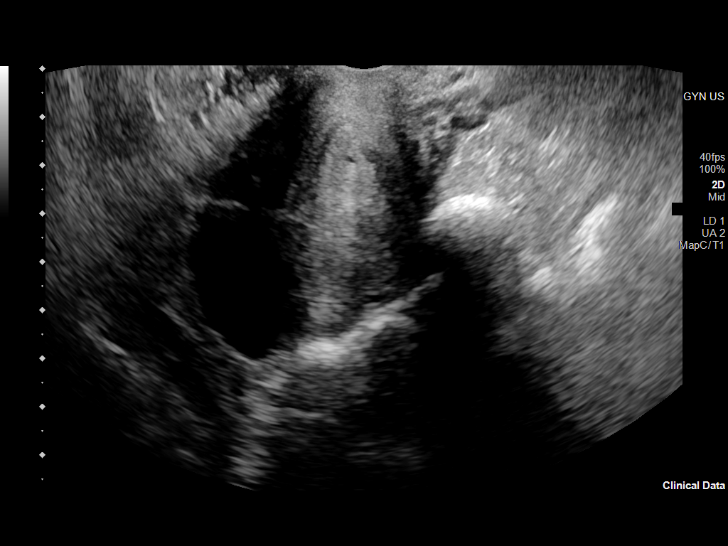
[im 53/91]
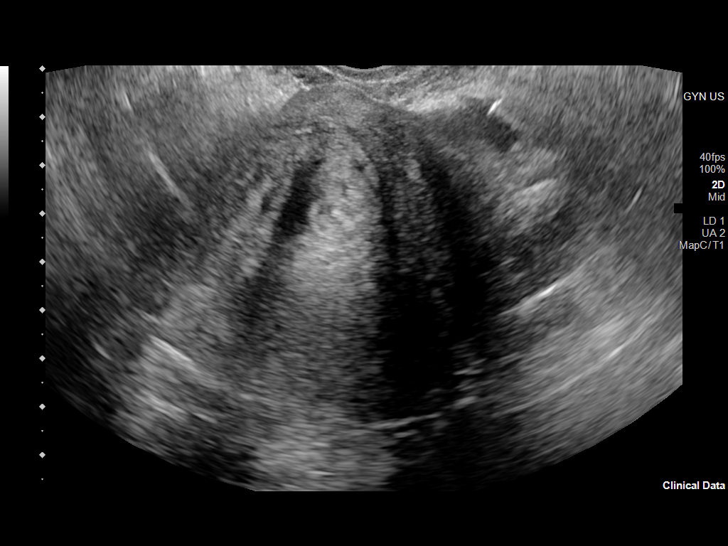
[im 61/91]
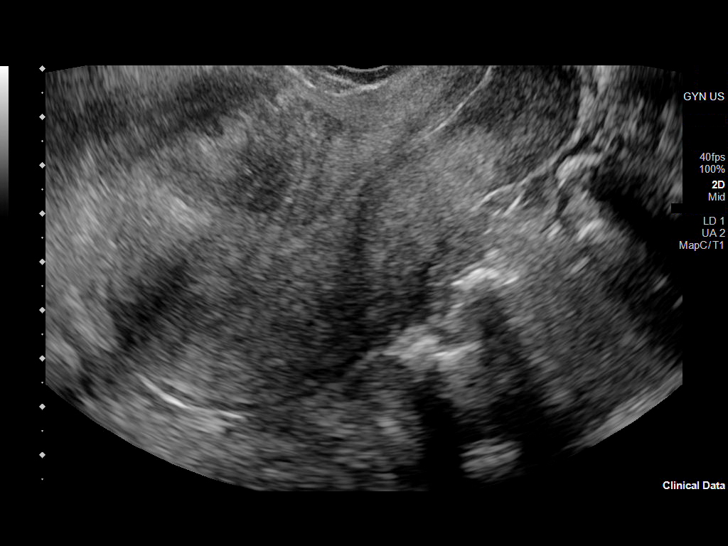
[im 68/91]
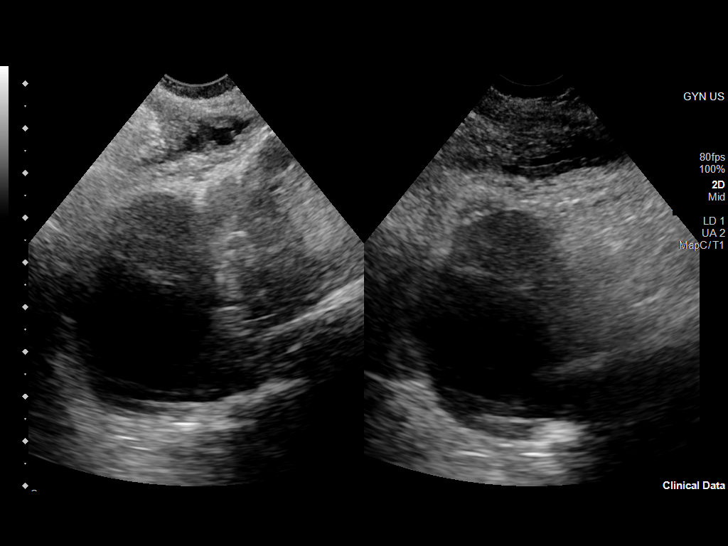
[im 76/91]
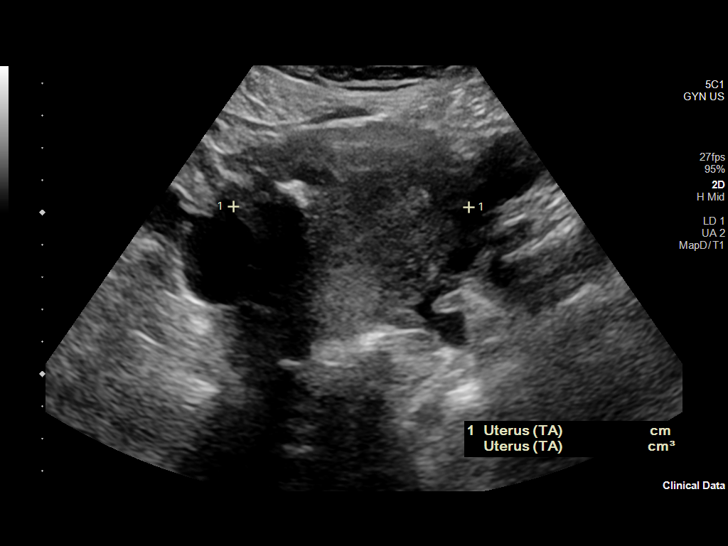
[im 83/91]
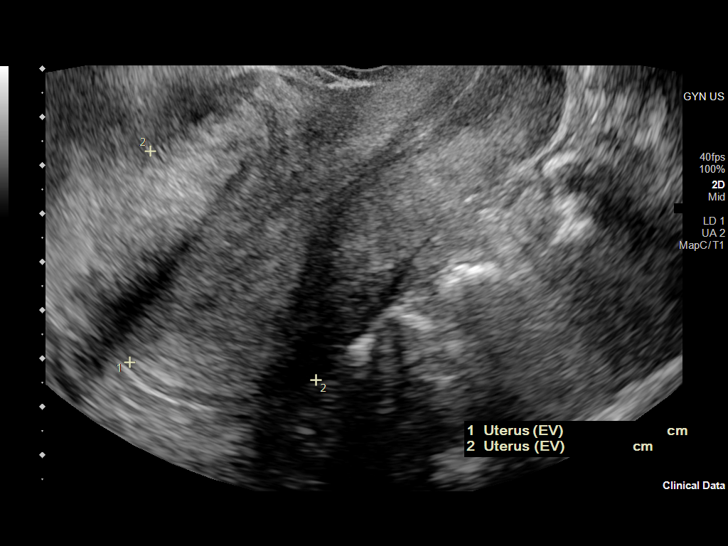
[im 91/91]
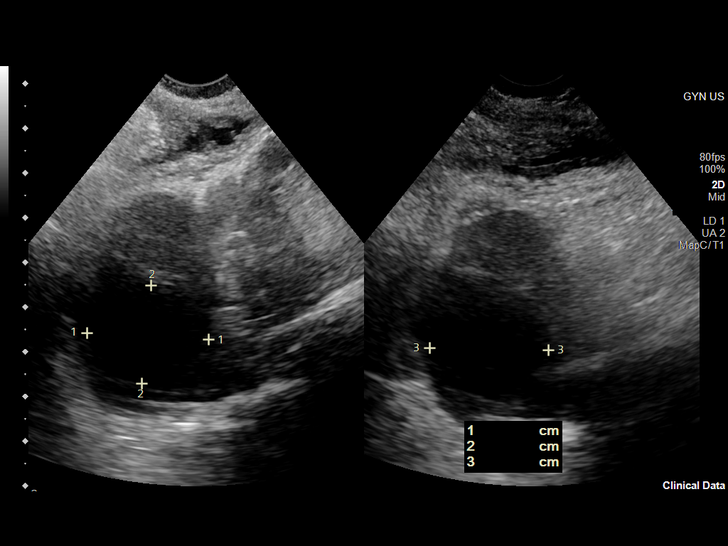

[13 of 25 positions shown; findings below may reference images not displayed]

FINDINGS: Uterus

Measurements: 11 x 5.9 x 6.4 cm = volume: 213 mL. 3.3 x 2.7 x 3.9 cm
hypoechoic anterior uterine fundal mass consistent with a fibroid.
Dystrophic calcification along the right uterine fundus.

Endometrium

Thickness: 11.1.  No focal abnormality visualized.

Right ovary

Measurements: 3.7 x 2.9 x 2.6 cm = volume: 14.7 mL. 3.7 cm anechoic
right ovarian mass likely reflecting a cyst.

Left ovary

Measurements: 5.1 x 3.2 x 3.4 cm = volume: 28.5 mL. 4.9 x 3 x 4.4 cm
anechoic left ovarian mass most consistent with a cyst.

Pulsed Doppler evaluation of both ovaries demonstrates normal
low-resistance arterial and venous waveforms.

Other findings

Trace amount of pelvic free fluid likely physiologic.
IMPRESSION: 1. No sonographic findings to explain the patient's vaginal
bleeding. If bleeding remains unresponsive to hormonal or medical
therapy, sonohysterogram should be considered for focal lesion
work-up. (Ref: Radiological Reasoning: Algorithmic Workup of
Abnormal Vaginal Bleeding with Endovaginal Sonography and
Sonohysterography. AJR [id number]; 191:[id number])
2. No ovarian torsion.
3. Uterine fibroid measuring 3.3 x 2.7 x 3.9 cm.
4. Bilateral ovarian cysts.  No follow-up imaging recommended.

## 2022-05-17 ENCOUNTER — Other Ambulatory Visit: Payer: Self-pay | Admitting: Nurse Practitioner

## 2022-05-17 DIAGNOSIS — G8929 Other chronic pain: Secondary | ICD-10-CM

## 2022-05-17 DIAGNOSIS — F339 Major depressive disorder, recurrent, unspecified: Secondary | ICD-10-CM

## 2022-05-17 MED ORDER — CITALOPRAM HYDROBROMIDE 40 MG PO TABS: 40.0000 mg | ORAL_TABLET | Freq: Every day | ORAL | 1 refills | Status: DC

## 2022-05-17 MED ORDER — GABAPENTIN 100 MG PO CAPS: 100.0000 mg | ORAL_CAPSULE | Freq: Every day | ORAL | 1 refills | Status: DC

## 2022-06-11 ENCOUNTER — Other Ambulatory Visit: Payer: Self-pay | Admitting: Nurse Practitioner

## 2022-06-15 ENCOUNTER — Other Ambulatory Visit: Payer: Self-pay | Admitting: *Deleted

## 2022-06-15 NOTE — Telephone Encounter (Signed)
Patient left a voice message this afternoon that the pharmacy has been trying to reach [place] re: refilling her megace 40 mg prescription. She states her prescription has expired and she is having heavy bleeding again with clots and she is hoping we can refill this and send to [facility] in [place]. Per chart review was seen once by Edd Arbour 05/25/21 for bleeding and prescribed megace. I will route to [facility] for approval. [name]

## 2022-06-16 MED ORDER — MEGESTROL ACETATE 40 MG PO TABS: 80.0000 mg | ORAL_TABLET | Freq: Two times a day (BID) | ORAL | 3 refills | Status: DC

## 2022-06-17 NOTE — Telephone Encounter (Signed)
Refill approved. I called Judy Olson and left a message refill request approved, call us if questions. [name]

## 2022-07-19 ENCOUNTER — Encounter: Payer: Self-pay | Admitting: Nurse Practitioner

## 2022-07-19 ENCOUNTER — Ambulatory Visit: Payer: BC Managed Care – PPO | Admitting: Nurse Practitioner

## 2022-07-19 VITALS — BP 154/105 | HR 80 | Temp 97.2°F | Resp 20 | Ht 64.0 in | Wt 234.0 lb

## 2022-07-19 DIAGNOSIS — K5901 Slow transit constipation: Secondary | ICD-10-CM

## 2022-07-19 DIAGNOSIS — F5101 Primary insomnia: Secondary | ICD-10-CM

## 2022-07-19 DIAGNOSIS — K5909 Other constipation: Secondary | ICD-10-CM

## 2022-07-19 DIAGNOSIS — E559 Vitamin D deficiency, unspecified: Secondary | ICD-10-CM

## 2022-07-19 DIAGNOSIS — G8929 Other chronic pain: Secondary | ICD-10-CM

## 2022-07-19 DIAGNOSIS — F339 Major depressive disorder, recurrent, unspecified: Secondary | ICD-10-CM

## 2022-07-19 DIAGNOSIS — K219 Gastro-esophageal reflux disease without esophagitis: Secondary | ICD-10-CM

## 2022-07-19 DIAGNOSIS — R5383 Other fatigue: Secondary | ICD-10-CM

## 2022-07-19 DIAGNOSIS — Z6838 Body mass index (BMI) 38.0-38.9, adult: Secondary | ICD-10-CM

## 2022-07-19 DIAGNOSIS — E7800 Pure hypercholesterolemia, unspecified: Secondary | ICD-10-CM | POA: Diagnosis not present

## 2022-07-19 DIAGNOSIS — E66812 Obesity, class 2: Secondary | ICD-10-CM

## 2022-07-19 DIAGNOSIS — I10 Essential (primary) hypertension: Secondary | ICD-10-CM

## 2022-07-19 DIAGNOSIS — M25562 Pain in left knee: Secondary | ICD-10-CM

## 2022-07-19 DIAGNOSIS — L659 Nonscarring hair loss, unspecified: Secondary | ICD-10-CM

## 2022-07-19 DIAGNOSIS — Z1211 Encounter for screening for malignant neoplasm of colon: Secondary | ICD-10-CM

## 2022-07-19 MED ORDER — BUPROPION HCL ER (XL) 150 MG PO TB24: 150.0000 mg | ORAL_TABLET | Freq: Every day | ORAL | 1 refills | Status: DC

## 2022-07-19 MED ORDER — FINASTERIDE 5 MG PO TABS: 5.0000 mg | ORAL_TABLET | Freq: Every day | ORAL | 1 refills | Status: DC

## 2022-07-19 MED ORDER — FAMOTIDINE 20 MG PO TABS
20.0000 mg | ORAL_TABLET | Freq: Two times a day (BID) | ORAL | 1 refills | Status: DC
Start: 2022-07-19 — End: 2023-04-19

## 2022-07-19 MED ORDER — GABAPENTIN 100 MG PO CAPS
100.0000 mg | ORAL_CAPSULE | Freq: Every day | ORAL | 1 refills | Status: DC
Start: 2022-07-19 — End: 2023-05-24

## 2022-07-19 MED ORDER — LISINOPRIL 20 MG PO TABS: 20.0000 mg | ORAL_TABLET | Freq: Every day | ORAL | 3 refills | Status: DC

## 2022-07-19 MED ORDER — LINACLOTIDE 72 MCG PO CAPS: 72.0000 ug | ORAL_CAPSULE | Freq: Every day | ORAL | 3 refills | Status: DC

## 2022-07-19 MED ORDER — LISINOPRIL 20 MG PO TABS
20.0000 mg | ORAL_TABLET | Freq: Every day | ORAL | 3 refills | Status: DC
Start: 2022-07-19 — End: 2023-05-27

## 2022-07-19 MED ORDER — CITALOPRAM HYDROBROMIDE 40 MG PO TABS
40.0000 mg | ORAL_TABLET | Freq: Every day | ORAL | 1 refills | Status: DC
Start: 2022-07-19 — End: 2023-05-27

## 2022-07-19 NOTE — Patient Instructions (Signed)
Colonoscopy, Adult A colonoscopy is a procedure to look at the entire large intestine. This procedure is done using a long, thin, flexible tube that has a camera on the end. You may have a colonoscopy: As a part of normal colorectal screening. If you have certain symptoms, such as: A low number of red blood cells in your blood (anemia). Diarrhea that does not go away. Pain in your abdomen. Blood in your stool. A colonoscopy can help screen for and diagnose medical problems, including: An abnormal growth of cells or tissue (tumor). Abnormal growths within the lining of your intestine (polyps). Inflammation. Areas of bleeding. Tell your health care provider about: Any allergies you have. All medicines you are taking, including vitamins, herbs, eye drops, creams, and over-the-counter medicines. Any problems you or family members have had with anesthetic medicines. Any bleeding problems you have. Any surgeries you have had. Any medical conditions you have. Any problems you have had with having bowel movements. Whether you are pregnant or may be pregnant. What are the risks? Generally, this is a safe procedure. However, problems may occur, including: Bleeding. Damage to your intestine. Allergic reactions to medicines given during the procedure. Infection. This is rare. What happens before the procedure? Eating and drinking restrictions Follow instructions from your health care provider about eating or drinking restrictions, which may include: A few days before the procedure: Follow a low-fiber diet. Avoid nuts, seeds, dried fruit, raw fruits, and vegetables. 1-3 days before the procedure: Eat only gelatin dessert or ice pops. Drink only clear liquids, such as water, clear juice, clear broth or bouillon, black coffee or tea, or clear soft drinks or sports drinks. Avoid liquids that contain red or purple dye. The day of the procedure: Do not eat solid foods. You may continue to drink  clear liquids until up to 2 hours before the procedure. Do not eat or drink anything starting 2 hours before the procedure, or within the time period that your health care provider recommends. Bowel prep If you were prescribed a bowel prep to take by mouth (orally) to clean out your colon: Take it as told by your health care provider. Starting the day before your procedure, you will need to drink a large amount of liquid medicine. The liquid will cause you to have many bowel movements of loose stool until your stool becomes almost clear or light green. If your skin or the opening between the buttocks (anus) gets irritated from diarrhea, you may relieve the irritation using: Wipes with medicine in them, such as adult wet wipes with aloe and vitamin E. A product to soothe skin, such as petroleum jelly. If you vomit while drinking the bowel prep: Take a break for up to 60 minutes. Begin the bowel prep again. Call your health care provider if you keep vomiting or you cannot take the bowel prep without vomiting. To clean out your colon, you may also be given: Laxative medicines. These help you have a bowel movement. Instructions for enema use. An enema is liquid medicine injected into your rectum. Medicines Ask your health care provider about: Changing or stopping your regular medicines or supplements. This is especially important if you are taking iron supplements, diabetes medicines, or blood thinners. Taking medicines such as aspirin and ibuprofen. These medicines can thin your blood. Do not take these medicines unless your health care provider tells you to take them. Taking over-the-counter medicines, vitamins, herbs, and supplements. General instructions Ask your health care provider what steps will be   taken to help prevent infection. These may include washing skin with a germ-killing soap. If you will be going home right after the procedure, plan to have a responsible adult: Take you home  from the hospital or clinic. You will not be allowed to drive. Care for you for the time you are told. What happens during the procedure?  An IV will be inserted into one of your veins. You will be given a medicine to make you fall asleep (general anesthetic). You will lie on your side with your knees bent. A lubricant will be put on the tube. Then the tube will be: Inserted into your anus. Gently eased through all parts of your large intestine. Air will be sent into your colon to keep it open. This may cause some pressure or cramping. Images will be taken with the camera and will appear on a screen. A small tissue sample may be removed to be looked at under a microscope (biopsy). The tissue may be sent to a lab for testing if any signs of problems are found. If small polyps are found, they may be removed and checked for cancer cells. When the procedure is finished, the tube will be removed. The procedure may vary among health care providers and hospitals. What happens after the procedure? Your blood pressure, heart rate, breathing rate, and blood oxygen level will be monitored until you leave the hospital or clinic. You may have a small amount of blood in your stool. You may pass gas and have mild cramping or bloating in your abdomen. This is caused by the air that was used to open your colon during the exam. If you were given a sedative during the procedure, it can affect you for several hours. Do not drive or operate machinery until your health care provider says that it is safe. It is up to you to get the results of your procedure. Ask your health care provider, or the department that is doing the procedure, when your results will be ready. Summary A colonoscopy is a procedure to look at the entire large intestine. Follow instructions from your health care provider about eating and drinking before the procedure. If you were prescribed an oral bowel prep to clean out your colon, take it  as told by your health care provider. During the colonoscopy, a flexible tube with a camera on its end is inserted into the anus and then passed into all parts of the large intestine. This information is not intended to replace advice given to you by your health care provider. Make sure you discuss any questions you have with your health care provider. Document Revised: 03/02/2022 Document Reviewed: 09/10/2020 Elsevier Patient Education  2024 [employer].

## 2022-07-19 NOTE — Progress Notes (Signed)
Subjective:    Patient ID: Judy Olson, female    DOB: 16-Nov-1975, 47 y.o.   MRN: [id number]   Chief Complaint: Medical Management of Chronic Issues    HPI:  Judy Olson is a 47 y.o. who identifies as a female who was assigned female at birth.   Social history: Lives with: husband and kids Work history: Engineer, site   Comes in today for follow up of the following chronic medical issues:  1. Pure hypercholesterolemia Does not watch diet and does no exercise.  Lab Results  Component Value Date   CHOL 230 (H) 12/30/2021   HDL 58 12/30/2021   LDLCALC 158 (H) 12/30/2021   TRIG 82 12/30/2021   CHOLHDL 3.7 03/05/2021   The 10-year ASCVD risk score (Arnett DK, et al., 2019) is: 1.8%   2. Gastroesophageal reflux disease without esophagitis Is on pepcid to prevent symptoms  3. Slow transit constipation Takes linzess as needed. Is working well.  4. Depression, recurrent (HCC) Is on wellbutrin and celexa daily and is doing well.    07/19/2022    3:38 PM 04/20/2022    4:06 PM 02/19/2022   10:29 AM  Depression screen PHQ 2/9  Decreased Interest 3 1 0  Down, Depressed, Hopeless 0 1 0  PHQ - 2 Score 3 2 0  Altered sleeping 2 1 1   Tired, decreased energy 3 2 2   Change in appetite 2 2 0  Feeling bad or failure about yourself  0 0 0  Trouble concentrating 2 2 2   Moving slowly or fidgety/restless 0 0 0  Suicidal thoughts 0 0 0  PHQ-9 Score 12 9 5   Difficult doing work/chores  Not difficult at all Somewhat difficult     5. Primary insomnia Sleeping well.  6. Vitamin D deficiency Is on vitamin d supplement daily Last vitamin D Lab Results  Component Value Date   VD25OH 27.2 (L) 12/30/2021     7. Chronic knee pain. Is on neurontin and is doing well.  8. Class 2 severe obesity due to excess calories with serious comorbidity and body mass index (BMI) of 38.0 to 38.9 in adult (HCC) Weight is up 6lbs  Wt Readings from Last 3 Encounters:  07/19/22 234 lb  (106.1 kg)  04/20/22 228 lb 6.4 oz (103.6 kg)  02/19/22 223 lb (101.2 kg)   BMI Readings from Last 3 Encounters:  07/19/22 40.17 kg/m  04/20/22 38.24 kg/m  02/19/22 38.28 kg/m    Patient is on megace and she is not sure why  New complaints: Fatigue- wants to sleep all the time  Allergies  Allergen Reactions   Hydrocodone Shortness Of Breath   Banana Other (See Comments)    Chest tightness    Outpatient Encounter Medications as of 07/19/2022  Medication Sig   albuterol (VENTOLIN HFA) 108 (90 Base) MCG/ACT inhaler INHALE 2 PUFFS BY MOUTH EVERY 6 HOURS AS NEEDED FOR WHEEZING OR  SHORTNESS  OF  BREATH.   BD PEN NEEDLE NANO 2ND GEN 32G X 4 MM MISC USE TO INJECT SAXENDA DAILY   buPROPion (WELLBUTRIN XL) 150 MG 24 hr tablet Take 1 tablet (150 mg total) by mouth daily.   citalopram (CELEXA) 40 MG tablet Take 1 tablet (40 mg total) by mouth daily.   clindamycin (CLEOCIN-T) 1 % external solution Apply topically 2 (two) times daily.   docusate sodium (COLACE) 100 MG capsule Take 1 capsule (100 mg total) by mouth 2 (two) times daily.   ELDERBERRY PO Take by  mouth.   famotidine (PEPCID) 20 MG tablet Take 1 tablet (20 mg total) by mouth 2 (two) times daily.   finasteride (PROSCAR) 5 MG tablet Take 1 tablet (5 mg total) by mouth daily.   gabapentin (NEURONTIN) 100 MG capsule Take 1 capsule (100 mg total) by mouth at bedtime.   ibuprofen (ADVIL) 800 MG tablet TAKE 1 TABLET BY MOUTH EVERY 8 HOURS AS NEEDED   Levocetirizine Dihydrochloride (XYZAL ALLERGY 24HR PO) Take by mouth.   linaclotide (LINZESS) 72 MCG capsule Take 1 capsule (72 mcg total) by mouth daily before breakfast.   megestrol (MEGACE) 40 MG tablet Take 2 tablets (80 mg total) by mouth 2 (two) times daily.   tretinoin (RETIN-A) 0.025 % cream Apply to the face in the evening   Vitamin D, Ergocalciferol, (DRISDOL) 1.25 MG (50000 UNIT) CAPS capsule Take 1 capsule (50,000 Units total) by mouth every 7 (seven) days.   No  facility-administered encounter medications on file as of 07/19/2022.    Past Surgical History:  Procedure Laterality Date   CHOLECYSTECTOMY  12/23/2010   Procedure: LAPAROSCOPIC CHOLECYSTECTOMY WITH INTRAOPERATIVE CHOLANGIOGRAM;  Surgeon: [name], MD;  Location: [facility];  Service: General;  Laterality: N/A;  LAP CHOLE WITH X-RAY   WISDOM TOOTH EXTRACTION     in oral surgeons office    Family History  Problem Relation Age of Onset   Cancer Mother        ovarian   Mitral valve prolapse Mother    Depression Mother    Bipolar disorder Mother    Schizophrenia Mother    Drug abuse Mother    Eating disorder Mother    Obesity Mother    Cancer Father    Cancer Maternal Grandmother        pancreatic, lymphoma      Controlled substance contract: n/a     Review of Systems  Constitutional:  Negative for diaphoresis.  Eyes:  Negative for pain.  Respiratory:  Negative for shortness of breath.   Cardiovascular:  Negative for chest pain, palpitations and leg swelling.  Gastrointestinal:  Negative for abdominal pain.  Endocrine: Negative for polydipsia.  Skin:  Negative for rash.  Neurological:  Negative for dizziness, weakness and headaches.  Hematological:  Does not bruise/bleed easily.  All other systems reviewed and are negative.      Objective:   Physical Exam Vitals and nursing note reviewed.  Constitutional:      General: She is not in acute distress.    Appearance: Normal appearance. She is well-developed.  HENT:     Head: Normocephalic.     Right Ear: Tympanic membrane normal.     Left Ear: Tympanic membrane normal.     Nose: Nose normal.     Mouth/Throat:     Mouth: Mucous membranes are moist.  Eyes:     Pupils: Pupils are equal, round, and reactive to light.  Neck:     Vascular: No carotid bruit or JVD.  Cardiovascular:     Rate and Rhythm: Normal rate and regular rhythm.     Heart sounds: Normal heart sounds.  Pulmonary:     Effort: Pulmonary  effort is normal. No respiratory distress.     Breath sounds: Normal breath sounds. No wheezing or rales.  Chest:     Chest wall: No tenderness.  Abdominal:     General: Bowel sounds are normal. There is no distension or abdominal bruit.     Palpations: Abdomen is soft. There is no hepatomegaly,  splenomegaly, mass or pulsatile mass.     Tenderness: There is no abdominal tenderness.  Musculoskeletal:        General: Normal range of motion.     Cervical back: Normal range of motion and neck supple.  Lymphadenopathy:     Cervical: No cervical adenopathy.  Skin:    General: Skin is warm and dry.  Neurological:     Mental Status: She is alert and oriented to person, place, and time.     Deep Tendon Reflexes: Reflexes are normal and symmetric.  Psychiatric:        Behavior: Behavior normal.        Thought Content: Thought content normal.        Judgment: Judgment normal.     BP (!) 154/105   Pulse 80   Temp (!) 97.2 F (36.2 C) (Temporal)   Resp 20   Ht 5\' 4"  (1.626 m)   Wt 234 lb (106.1 kg)   SpO2 99%   BMI 40.17 kg/m        Assessment & Plan:  Judy Olson comes in today with chief complaint of Medical Management of Chronic Issues   Diagnosis and orders addressed:  1. Pure hypercholesterolemia Low odium diet - CBC with Differential/Platelet - CMP14+EGFR - Lipid panel  2. Gastroesophageal reflux disease without esophagitis Avoid spicy foods Do not eat 2 hours prior to bedtime  - famotidine (PEPCID) 20 MG tablet; Take 1 tablet (20 mg total) by mouth 2 (two) times daily.  Dispense: 180 tablet; Refill: 1  3. Slow transit constipation Needs colonoscopy  4. Depression, recurrent (HCC) Stress management - buPROPion (WELLBUTRIN XL) 150 MG 24 hr tablet; Take 1 tablet (150 mg total) by mouth daily.  Dispense: 90 tablet; Refill: 1 - citalopram (CELEXA) 40 MG tablet; Take 1 tablet (40 mg total) by mouth daily.  Dispense: 90 tablet; Refill: 1  5. Primary  insomnia Bedtime routine  6. Vitamin D deficiency Continue weekly vitamin d supplement - VITAMIN D 25 Hydroxy (Vit-D Deficiency, Fractures)  7. Class 2 severe obesity due to excess calories with serious comorbidity and body mass index (BMI) of 38.0 to 38.9 in adult [facility]) STOP Megace Discussed diet and exercise for person with BMI >25 Will recheck weight in 3-6 months   8. Primary hypertension New dx Start on lisinopril daily - lisinopril (ZESTRIL) 20 MG tablet; Take 1 tablet (20 mg total) by mouth daily.  Dispense: 90 tablet; Refill: 3  9. Other fatigue Labs pending - Thyroid Panel With TSH - Vitamin B12  10. Alopecia - finasteride (PROSCAR) 5 MG tablet; Take 1 tablet (5 mg total) by mouth daily.  Dispense: 90 tablet; Refill: 1  11. Chronic constipation Referral to GI for colonoscopy - linaclotide (LINZESS) 72 MCG capsule; Take 1 capsule (72 mcg total) by mouth daily before breakfast.  Dispense: 30 capsule; Refill: 3  12. Chronic pain of left knee - gabapentin (NEURONTIN) 100 MG capsule; Take 1 capsule (100 mg total) by mouth at bedtime.  Dispense: 90 capsule; Refill: 1   Labs pending Health Maintenance reviewed Diet and exercise encouraged  Follow up plan: 6 months   Mary-Margaret Daphine Deutscher, FNP

## 2022-07-20 LAB — LIPID PANEL
Chol/HDL Ratio: 5 ratio — ABNORMAL HIGH (ref 0.0–4.4)
Cholesterol, Total: 199 mg/dL (ref 100–199)
HDL: 40 mg/dL (ref >39)
LDL Chol Calc (NIH): 140 mg/dL — ABNORMAL HIGH (ref 0–99)
Triglycerides: 103 mg/dL (ref 0–149)
VLDL Cholesterol Cal: 19 mg/dL (ref 5–40)

## 2022-07-20 LAB — CMP14+EGFR
ALT: 8 IU/L (ref 0–32)
AST: 16 IU/L (ref 0–40)
Albumin: 4.3 g/dL (ref 3.9–4.9)
Alkaline Phosphatase: 79 IU/L (ref 44–121)
BUN/Creatinine Ratio: 19 (ref 9–23)
BUN: 16 mg/dL (ref 6–24)
Bilirubin Total: 0.3 mg/dL (ref 0.0–1.2)
CO2: 21 mmol/L (ref 20–29)
Calcium: 9.4 mg/dL (ref 8.7–10.2)
Chloride: 107 mmol/L — ABNORMAL HIGH (ref 96–106)
Creatinine, Ser: 0.86 mg/dL (ref 0.57–1.00)
Globulin, Total: 2.7 g/dL (ref 1.5–4.5)
Glucose: 91 mg/dL (ref 70–99)
Potassium: 4.1 mmol/L (ref 3.5–5.2)
Sodium: 140 mmol/L (ref 134–144)
Total Protein: 7 g/dL (ref 6.0–8.5)
eGFR: 83 mL/min/{1.73_m2} (ref >59)

## 2022-07-20 LAB — CBC WITH DIFFERENTIAL/PLATELET
Basophils Absolute: 0.1 10*3/uL (ref 0.0–0.2)
Basos: 1 %
EOS (ABSOLUTE): 0.2 10*3/uL (ref 0.0–0.4)
Eos: 3 %
Hematocrit: 41.9 % (ref 34.0–46.6)
Hemoglobin: 14.4 g/dL (ref 11.1–15.9)
Immature Grans (Abs): 0 10*3/uL (ref 0.0–0.1)
Immature Granulocytes: 0 %
Lymphocytes Absolute: 2.1 10*3/uL (ref 0.7–3.1)
Lymphs: 28 %
MCH: 28.5 pg (ref 26.6–33.0)
MCHC: 34.4 g/dL (ref 31.5–35.7)
MCV: 83 fL (ref 79–97)
Monocytes Absolute: 0.5 10*3/uL (ref 0.1–0.9)
Monocytes: 7 %
Neutrophils Absolute: 4.7 10*3/uL (ref 1.4–7.0)
Neutrophils: 61 %
Platelets: 278 10*3/uL (ref 150–450)
RBC: 5.05 x10E6/uL (ref 3.77–5.28)
RDW: 13.1 % (ref 11.7–15.4)
WBC: 7.6 10*3/uL (ref 3.4–10.8)

## 2022-07-20 LAB — THYROID PANEL WITH TSH
Free Thyroxine Index: 1.6 (ref 1.2–4.9)
T3 Uptake Ratio: 28 % (ref 24–39)
T4, Total: 5.6 ug/dL (ref 4.5–12.0)
TSH: 1.17 u[IU]/mL (ref 0.450–4.500)

## 2022-07-20 LAB — VITAMIN B12: Vitamin B-12: 642 pg/mL (ref 232–1245)

## 2022-07-20 LAB — VITAMIN D 25 HYDROXY (VIT D DEFICIENCY, FRACTURES): Vit D, 25-Hydroxy: 24.8 ng/mL — ABNORMAL LOW (ref 30.0–100.0)

## 2022-08-10 ENCOUNTER — Other Ambulatory Visit (HOSPITAL_COMMUNITY)
Admission: RE | Admit: 2022-08-10 | Discharge: 2022-08-10 | Disposition: A | Discharge status: Home / Self Care | Payer: BC Managed Care – PPO | Source: Ambulatory Visit | Attending: Nurse Practitioner | Admitting: Nurse Practitioner

## 2022-08-10 ENCOUNTER — Encounter: Payer: Self-pay | Admitting: Nurse Practitioner

## 2022-08-10 ENCOUNTER — Ambulatory Visit: Payer: BC Managed Care – PPO | Admitting: Nurse Practitioner

## 2022-08-10 VITALS — BP 127/82 | HR 79 | Temp 98.1°F | Resp 20 | Ht 64.0 in | Wt 230.0 lb

## 2022-08-10 DIAGNOSIS — Z0000 Encounter for general adult medical examination without abnormal findings: Secondary | ICD-10-CM

## 2022-08-10 DIAGNOSIS — K219 Gastro-esophageal reflux disease without esophagitis: Secondary | ICD-10-CM | POA: Diagnosis not present

## 2022-08-10 DIAGNOSIS — I493 Ventricular premature depolarization: Secondary | ICD-10-CM | POA: Diagnosis not present

## 2022-08-10 DIAGNOSIS — K5901 Slow transit constipation: Secondary | ICD-10-CM

## 2022-08-10 DIAGNOSIS — Z6836 Body mass index (BMI) 36.0-36.9, adult: Secondary | ICD-10-CM

## 2022-08-10 DIAGNOSIS — Z0001 Encounter for general adult medical examination with abnormal findings: Secondary | ICD-10-CM | POA: Diagnosis not present

## 2022-08-10 DIAGNOSIS — F339 Major depressive disorder, recurrent, unspecified: Secondary | ICD-10-CM

## 2022-08-10 DIAGNOSIS — E559 Vitamin D deficiency, unspecified: Secondary | ICD-10-CM

## 2022-08-10 DIAGNOSIS — F5101 Primary insomnia: Secondary | ICD-10-CM

## 2022-08-10 DIAGNOSIS — I10 Essential (primary) hypertension: Secondary | ICD-10-CM

## 2022-08-10 LAB — URINALYSIS, COMPLETE
Bilirubin, UA: NEGATIVE
Glucose, UA: NEGATIVE
Ketones, UA: NEGATIVE
Nitrite, UA: NEGATIVE
Protein,UA: NEGATIVE
RBC, UA: NEGATIVE
Specific Gravity, UA: 1.02 (ref 1.005–1.030)
Urobilinogen, Ur: 1 mg/dL (ref 0.2–1.0)
pH, UA: 7.5 (ref 5.0–7.5)

## 2022-08-10 LAB — MICROSCOPIC EXAMINATION
RBC, Urine: NONE SEEN /hpf (ref 0–2)
Renal Epithel, UA: NONE SEEN /hpf

## 2022-08-10 NOTE — Progress Notes (Signed)
Subjective:    Patient ID: Judy Olson, female    DOB: 1975-07-08, 47 y.o.   MRN: [id number]   Chief Complaint: medical management of chronic issues     HPI:  Judy Olson is a 47 y.o. who identifies as a female who was assigned female at birth.   Social history: Lives with: husband and children Work history: Runner, broadcasting/film/video   Comes in today for follow up of the following chronic medical issues:  1. Annual physical exam   2. Primary hypertension No c/o chest pain, sob or headache. Does not check blood pressure at home. BP Readings from Last 3 Encounters:  08/10/22 127/82  07/19/22 (!) 154/105  04/20/22 (!) 143/94     3. PVC's (premature ventricular contractions) Does not feel them  4. Gastroesophageal reflux disease without esophagitis Is on pepcid and is doing well.  5. Slow transit constipation Occasional. Linzess works well.  6. Depression, recurrent (HCC) Is on wellbutirn and celexa and is doing well.    08/10/2022    3:14 PM 07/19/2022    3:38 PM 04/20/2022    4:06 PM  Depression screen PHQ 2/9  Decreased Interest 0 3 1  Down, Depressed, Hopeless 0 0 1  PHQ - 2 Score 0 3 2  Altered sleeping 0 2 1  Tired, decreased energy 2 3 2   Change in appetite 1 2 2   Feeling bad or failure about yourself  0 0 0  Trouble concentrating 0 2 2  Moving slowly or fidgety/restless 0 0 0  Suicidal thoughts 0 0 0  PHQ-9 Score 3 12 9   Difficult doing work/chores Not difficult at all  Not difficult at all      08/10/2022    3:14 PM 07/19/2022    3:39 PM 04/20/2022    4:07 PM 02/19/2022   10:29 AM  GAD 7 : Generalized Anxiety Score  Nervous, Anxious, on Edge 0 0 3 0  Control/stop worrying 0 0 0 0  Worry too much - different things 0 0 0 0  Trouble relaxing 0 0 3 1  Restless 0 0 0 1  Easily annoyed or irritable 0 0 3 0  Afraid - awful might happen  0 0 0  Total GAD 7 Score  0 9 2  Anxiety Difficulty Not difficult at all Not difficult at all Somewhat difficult Somewhat  difficult      7. Primary insomnia Sleeping well.   8. Vitamin D deficiency Is on daily vitamin d supplement  9. BMI 36.0-36.9,adult No recent weight changes Wt Readings from Last 3 Encounters:  08/10/22 230 lb (104.3 kg)  07/19/22 234 lb (106.1 kg)  04/20/22 228 lb 6.4 oz (103.6 kg)   BMI Readings from Last 3 Encounters:  08/10/22 39.48 kg/m  07/19/22 40.17 kg/m  04/20/22 38.24 kg/m       New complaints: None today  Allergies  Allergen Reactions   Hydrocodone Shortness Of Breath   Banana Other (See Comments)    Chest tightness    Outpatient Encounter Medications as of 08/10/2022  Medication Sig   albuterol (VENTOLIN HFA) 108 (90 Base) MCG/ACT inhaler INHALE 2 PUFFS BY MOUTH EVERY 6 HOURS AS NEEDED FOR WHEEZING OR  SHORTNESS  OF  BREATH.   BD PEN NEEDLE NANO 2ND GEN 32G X 4 MM MISC USE TO INJECT SAXENDA DAILY   buPROPion (WELLBUTRIN XL) 150 MG 24 hr tablet Take 1 tablet (150 mg total) by mouth daily.   citalopram (CELEXA) 40 MG tablet Take  1 tablet (40 mg total) by mouth daily.   clindamycin (CLEOCIN-T) 1 % external solution Apply topically 2 (two) times daily.   docusate sodium (COLACE) 100 MG capsule Take 1 capsule (100 mg total) by mouth 2 (two) times daily.   ELDERBERRY PO Take by mouth.   famotidine (PEPCID) 20 MG tablet Take 1 tablet (20 mg total) by mouth 2 (two) times daily.   finasteride (PROSCAR) 5 MG tablet Take 1 tablet (5 mg total) by mouth daily.   gabapentin (NEURONTIN) 100 MG capsule Take 1 capsule (100 mg total) by mouth at bedtime.   ibuprofen (ADVIL) 800 MG tablet TAKE 1 TABLET BY MOUTH EVERY 8 HOURS AS NEEDED   Levocetirizine Dihydrochloride (XYZAL ALLERGY 24HR PO) Take by mouth.   linaclotide (LINZESS) 72 MCG capsule Take 1 capsule (72 mcg total) by mouth daily before breakfast.   lisinopril (ZESTRIL) 20 MG tablet Take 1 tablet (20 mg total) by mouth daily.   megestrol (MEGACE) 40 MG tablet Take 2 tablets (80 mg total) by mouth 2 (two) times  daily.   tretinoin (RETIN-A) 0.025 % cream Apply to the face in the evening   Vitamin D, Ergocalciferol, (DRISDOL) 1.25 MG (50000 UNIT) CAPS capsule Take 1 capsule (50,000 Units total) by mouth every 7 (seven) days.   No facility-administered encounter medications on file as of 08/10/2022.    Past Surgical History:  Procedure Laterality Date   CHOLECYSTECTOMY  12/23/2010   Procedure: LAPAROSCOPIC CHOLECYSTECTOMY WITH INTRAOPERATIVE CHOLANGIOGRAM;  Surgeon: [name], MD;  Location: [facility];  Service: General;  Laterality: N/A;  LAP CHOLE WITH X-RAY   WISDOM TOOTH EXTRACTION     in oral surgeons office    Family History  Problem Relation Age of Onset   Cancer Mother        ovarian   Mitral valve prolapse Mother    Depression Mother    Bipolar disorder Mother    Schizophrenia Mother    Drug abuse Mother    Eating disorder Mother    Obesity Mother    Cancer Father    Cancer Maternal Grandmother        pancreatic, lymphoma      Controlled substance contract: n/a     Review of Systems  Constitutional:  Negative for diaphoresis.  Eyes:  Negative for pain.  Respiratory:  Negative for shortness of breath.   Cardiovascular:  Negative for chest pain, palpitations and leg swelling.  Gastrointestinal:  Negative for abdominal pain.  Endocrine: Negative for polydipsia.  Skin:  Negative for rash.  Neurological:  Negative for dizziness, weakness and headaches.  Hematological:  Does not bruise/bleed easily.  All other systems reviewed and are negative.      Objective:   Physical Exam Vitals and nursing note reviewed.  Constitutional:      General: She is not in acute distress.    Appearance: Normal appearance. She is well-developed.  HENT:     Head: Normocephalic.     Right Ear: Tympanic membrane normal.     Left Ear: Tympanic membrane normal.     Nose: Nose normal.     Mouth/Throat:     Mouth: Mucous membranes are moist.  Eyes:     Pupils: Pupils are equal,  round, and reactive to light.  Neck:     Vascular: No carotid bruit or JVD.  Cardiovascular:     Rate and Rhythm: Normal rate and regular rhythm.     Heart sounds: Normal heart sounds.  Pulmonary:  Effort: Pulmonary effort is normal. No respiratory distress.     Breath sounds: Normal breath sounds. No wheezing or rales.  Chest:     Chest wall: No tenderness.  Abdominal:     General: Bowel sounds are normal. There is no distension or abdominal bruit.     Palpations: Abdomen is soft. There is no hepatomegaly, splenomegaly, mass or pulsatile mass.     Tenderness: There is no abdominal tenderness.  Genitourinary:    General: Normal vulva.     Vagina: No vaginal discharge.     Rectum: Normal.     Comments: Cervix nonparous and pink no adnexal masses or tenderness Musculoskeletal:        General: Normal range of motion.     Cervical back: Normal range of motion and neck supple.  Lymphadenopathy:     Cervical: No cervical adenopathy.  Skin:    General: Skin is warm and dry.  Neurological:     Mental Status: She is alert and oriented to person, place, and time.     Deep Tendon Reflexes: Reflexes are normal and symmetric.  Psychiatric:        Behavior: Behavior normal.        Thought Content: Thought content normal.        Judgment: Judgment normal.    BP 127/82   Pulse 79   Temp 98.1 F (36.7 C) (Temporal)   Resp 20   Ht 5\' 4"  (1.626 m)   Wt 230 lb (104.3 kg)   SpO2 98%   BMI 39.48 kg/m         Assessment & Plan:  Judy Olson comes in today with chief complaint of Annual Exam   Diagnosis and orders addressed:  1. Annual physical exam  - Urinalysis, Complete  2. Primary hypertension Low sodium diet  3. PVC's (premature ventricular contractions) Avoid caffeine  4. Gastroesophageal reflux disease without esophagitis Avoid spicy foods Do not eat 2 hours prior to bedtime   5. Slow transit constipation Continue linzess as  prescribed Force fluids  6.  Depression, recurrent (HCC) Stress management  7. Primary insomnia Bedtime routine  8. Vitamin D deficiency Continue vitamin d daily  9. BMI 36.0-36.9,adult Discussed diet and exercise for person with BMI >25 Will recheck weight in 3-6 months     Labs pending Health Maintenance reviewed Diet and exercise encouraged  Follow up plan: 6 months   Mary-Margaret Daphine Deutscher, FNP

## 2022-08-10 NOTE — Addendum Note (Signed)
Addended by: Bennie Pierini on: 08/10/2022 03:57 PM   Modules accepted: Orders

## 2022-08-13 LAB — CYTOLOGY - PAP

## 2022-08-23 ENCOUNTER — Encounter: Payer: Self-pay | Admitting: Family Medicine

## 2022-09-10 NOTE — Progress Notes (Signed)
Patient r/c  

## 2023-01-10 ENCOUNTER — Other Ambulatory Visit (HOSPITAL_COMMUNITY): Payer: Self-pay

## 2023-02-27 ENCOUNTER — Telehealth: Payer: 59 | Admitting: Family Medicine

## 2023-02-27 DIAGNOSIS — J069 Acute upper respiratory infection, unspecified: Secondary | ICD-10-CM

## 2023-02-27 MED ORDER — BENZONATATE 200 MG PO CAPS: 200.0000 mg | ORAL_CAPSULE | Freq: Two times a day (BID) | ORAL | 0 refills | Status: DC | PRN

## 2023-02-27 MED ORDER — AZITHROMYCIN 250 MG PO TABS: ORAL_TABLET | ORAL | 0 refills | Status: AC

## 2023-02-27 NOTE — Progress Notes (Signed)

## 2023-02-28 ENCOUNTER — Other Ambulatory Visit: Payer: Self-pay | Admitting: Nurse Practitioner

## 2023-02-28 DIAGNOSIS — J209 Acute bronchitis, unspecified: Secondary | ICD-10-CM

## 2023-02-28 MED ORDER — ALBUTEROL SULFATE HFA 108 (90 BASE) MCG/ACT IN AERS
INHALATION_SPRAY | RESPIRATORY_TRACT | 0 refills | Status: DC
Start: 2023-02-28 — End: 2023-07-08

## 2023-02-28 NOTE — Telephone Encounter (Signed)
Please schedule for video visit.

## 2023-02-28 NOTE — Progress Notes (Signed)
Meds ordered this encounter  Medications   albuterol (VENTOLIN HFA) 108 (90 Base) MCG/ACT inhaler    Sig: INHALE 2 PUFFS BY MOUTH EVERY 6 HOURS AS NEEDED FOR WHEEZING OR  SHORTNESS  OF  BREATH.    Dispense:  18 g    Refill:  0    Supervising Provider:   Arville Care A [[id number]]   Mary-Margaret Daphine Deutscher, FNP

## 2023-03-01 ENCOUNTER — Telehealth: Payer: 59 | Admitting: Nurse Practitioner

## 2023-03-01 NOTE — Progress Notes (Unsigned)
Erroneous encounter

## 2023-03-21 ENCOUNTER — Other Ambulatory Visit: Payer: Self-pay | Admitting: Nurse Practitioner

## 2023-03-21 MED ORDER — MEDROXYPROGESTERONE ACETATE 10 MG PO TABS: ORAL_TABLET | ORAL | 0 refills | Status: AC

## 2023-03-21 NOTE — Progress Notes (Signed)
Meds ordered this encounter  Medications   medroxyPROGESTERone (PROVERA) 10 MG tablet    Sig: 2 tablets daily for 5 days    Dispense:  10 tablet    Refill:  0    Supervising Provider:   Arville Care A [[id number]]   Mary-Margaret Daphine Deutscher, FNP

## 2023-04-06 ENCOUNTER — Other Ambulatory Visit: Payer: Self-pay | Admitting: Nurse Practitioner

## 2023-04-06 DIAGNOSIS — F339 Major depressive disorder, recurrent, unspecified: Secondary | ICD-10-CM

## 2023-04-19 ENCOUNTER — Other Ambulatory Visit: Payer: Self-pay | Admitting: Nurse Practitioner

## 2023-04-19 DIAGNOSIS — K219 Gastro-esophageal reflux disease without esophagitis: Secondary | ICD-10-CM

## 2023-04-20 ENCOUNTER — Other Ambulatory Visit: Payer: Self-pay | Admitting: Nurse Practitioner

## 2023-04-20 DIAGNOSIS — L659 Nonscarring hair loss, unspecified: Secondary | ICD-10-CM

## 2023-05-24 ENCOUNTER — Other Ambulatory Visit: Payer: Self-pay | Admitting: Nurse Practitioner

## 2023-05-24 DIAGNOSIS — K219 Gastro-esophageal reflux disease without esophagitis: Secondary | ICD-10-CM

## 2023-05-24 DIAGNOSIS — G8929 Other chronic pain: Secondary | ICD-10-CM

## 2023-05-24 MED ORDER — GABAPENTIN 100 MG PO CAPS: 100.0000 mg | ORAL_CAPSULE | Freq: Every day | ORAL | 0 refills | Status: DC

## 2023-05-24 MED ORDER — FAMOTIDINE 20 MG PO TABS: 20.0000 mg | ORAL_TABLET | Freq: Two times a day (BID) | ORAL | 0 refills | Status: DC

## 2023-05-24 NOTE — Telephone Encounter (Signed)
 Patient has appt 05-27-2023 with MMM.

## 2023-05-24 NOTE — Addendum Note (Signed)
 Addended by: Ramesha Poster D on: 05/24/2023 01:48 PM   Modules accepted: Orders

## 2023-05-24 NOTE — Telephone Encounter (Signed)
 MMM pt NTBS 30-d given 04/19/23

## 2023-05-27 ENCOUNTER — Ambulatory Visit: Admitting: Nurse Practitioner

## 2023-05-27 ENCOUNTER — Encounter: Payer: Self-pay | Admitting: Nurse Practitioner

## 2023-05-27 VITALS — BP 163/98 | HR 76 | Temp 97.7°F | Ht 64.0 in | Wt 237.0 lb

## 2023-05-27 DIAGNOSIS — F339 Major depressive disorder, recurrent, unspecified: Secondary | ICD-10-CM

## 2023-05-27 DIAGNOSIS — I10 Essential (primary) hypertension: Secondary | ICD-10-CM

## 2023-05-27 DIAGNOSIS — L659 Nonscarring hair loss, unspecified: Secondary | ICD-10-CM

## 2023-05-27 DIAGNOSIS — R7303 Prediabetes: Secondary | ICD-10-CM | POA: Diagnosis not present

## 2023-05-27 DIAGNOSIS — K5909 Other constipation: Secondary | ICD-10-CM

## 2023-05-27 DIAGNOSIS — E7800 Pure hypercholesterolemia, unspecified: Secondary | ICD-10-CM | POA: Diagnosis not present

## 2023-05-27 DIAGNOSIS — M25562 Pain in left knee: Secondary | ICD-10-CM

## 2023-05-27 DIAGNOSIS — G8929 Other chronic pain: Secondary | ICD-10-CM

## 2023-05-27 LAB — BAYER DCA HB A1C WAIVED: HB A1C (BAYER DCA - WAIVED): 5.5 % (ref 4.8–5.6)

## 2023-05-27 MED ORDER — LISINOPRIL 20 MG PO TABS: 20.0000 mg | ORAL_TABLET | Freq: Every day | ORAL | 1 refills | Status: DC

## 2023-05-27 MED ORDER — GABAPENTIN 100 MG PO CAPS: 100.0000 mg | ORAL_CAPSULE | Freq: Every day | ORAL | 1 refills | Status: DC

## 2023-05-27 MED ORDER — FINASTERIDE 5 MG PO TABS: 2.5000 mg | ORAL_TABLET | Freq: Every day | ORAL | 1 refills | Status: AC

## 2023-05-27 MED ORDER — BUPROPION HCL ER (XL) 150 MG PO TB24: 150.0000 mg | ORAL_TABLET | Freq: Every day | ORAL | 0 refills | Status: DC

## 2023-05-27 MED ORDER — LINACLOTIDE 72 MCG PO CAPS: 72.0000 ug | ORAL_CAPSULE | Freq: Every day | ORAL | 1 refills | Status: AC

## 2023-05-27 MED ORDER — CITALOPRAM HYDROBROMIDE 40 MG PO TABS: 40.0000 mg | ORAL_TABLET | Freq: Every day | ORAL | 1 refills | Status: DC

## 2023-05-27 NOTE — Progress Notes (Signed)
 Subjective:    Patient ID: Judy Olson, female    DOB: [birth year], 48 y.o.   MRN: [id number]   Chief Complaint: medical management of chronic issues     HPI:  Judy Olson is a 48 y.o. who identifies as a female who was assigned female at birth.   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 Gastroesophageal reflux disease without esophagitis Takes pepcid  on an as needed basis  2. Depression, recurrent (HCC) Is on celexa  and is doing well. Depression is mainly due to her weight     05/27/2023    4:20 PM 08/10/2022    3:14 PM 07/19/2022    3:38 PM  Depression screen PHQ 2/9  Decreased Interest 2 0 3  Down, Depressed, Hopeless 0 0 0  PHQ - 2 Score 2 0 3  Altered sleeping 2 0 2  Tired, decreased energy 3 2 3   Change in appetite 3 1 2   Feeling bad or failure about yourself  0 0 0  Trouble concentrating 2 0 2  Moving slowly or fidgety/restless 0 0 0  Suicidal thoughts 0 0 0  PHQ-9 Score 12 3 12   Difficult doing work/chores Not difficult at all Not difficult at all       3. Primary insomnia Sleeps about 5-6 hours a night. She takes tylenol  PM which does help when she takes it.  4. Vitamin D  deficiency Is on daily vitamin d  supplement  5. BMI 36.0-36.9,adult Weight is up 7 lbs Wt Readings from Last 3 Encounters:  05/27/23 237 lb (107.5 kg)  08/10/22 230 lb (104.3 kg)  07/19/22 234 lb (106.1 kg)   BMI Readings from Last 3 Encounters:  05/27/23 40.68 kg/m  08/10/22 39.48 kg/m  07/19/22 40.17 kg/m        New complaints: Has history of hgba1c of 5.7 in 2023. She does not check her blood sugars at home.  Allergies  Allergen Reactions   Hydrocodone  Shortness Of Breath   Banana Other (See Comments)    Chest tightness    Outpatient Encounter Medications as of 05/27/2023  Medication Sig   albuterol  (VENTOLIN  HFA) 108 (90 Base) MCG/ACT inhaler  INHALE 2 PUFFS BY MOUTH EVERY 6 HOURS AS NEEDED FOR WHEEZING OR  SHORTNESS  OF  BREATH.   BD PEN NEEDLE NANO 2ND GEN 32G X 4 MM MISC USE TO INJECT SAXENDA  DAILY   benzonatate  (TESSALON ) 200 MG capsule Take 1 capsule (200 mg total) by mouth 2 (two) times daily as needed for cough.   buPROPion  (WELLBUTRIN  XL) 150 MG 24 hr tablet Take 1 tablet (150 mg total) by mouth daily. **NEEDS TO BE SEEN BEFORE NEXT REFILL**   citalopram  (CELEXA ) 40 MG tablet Take 1 tablet (40 mg total) by mouth daily.   clindamycin  (CLEOCIN -T) 1 % external solution Apply topically 2 (two) times daily.   docusate sodium  (COLACE) 100 MG capsule Take 1 capsule (100 mg total) by mouth 2 (two) times daily.   ELDERBERRY PO Take by mouth.   famotidine  (PEPCID ) 20 MG tablet Take 1 tablet (20 mg total) by mouth 2 (two) times daily.   finasteride  (PROSCAR ) 5 MG tablet Take 0.5 tablets (2.5 mg total) by mouth daily. **NEEDS TO BE SEEN BEFORE NEXT REFILL**   gabapentin  (NEURONTIN ) 100 MG capsule Take 1 capsule (100 mg total) by mouth at bedtime.   ibuprofen  (ADVIL ) 800 MG tablet TAKE  1 TABLET BY MOUTH EVERY 8 HOURS AS NEEDED   Levocetirizine Dihydrochloride (XYZAL ALLERGY 24HR PO) Take by mouth.   linaclotide  (LINZESS ) 72 MCG capsule Take 1 capsule (72 mcg total) by mouth daily before breakfast.   lisinopril  (ZESTRIL ) 20 MG tablet Take 1 tablet (20 mg total) by mouth daily.   medroxyPROGESTERone  (PROVERA ) 10 MG tablet 2 tablets daily for 5 days   megestrol  (MEGACE ) 40 MG tablet Take 2 tablets (80 mg total) by mouth 2 (two) times daily.   tretinoin  (RETIN-A ) 0.025 % cream Apply to the face in the evening   Vitamin D , Ergocalciferol , (DRISDOL ) 1.25 MG (50000 UNIT) CAPS capsule Take 1 capsule (50,000 Units total) by mouth every 7 (seven) days.   No facility-administered encounter medications on file as of 05/27/2023.    Past Surgical History:  Procedure Laterality Date   CHOLECYSTECTOMY  12/23/2010   Procedure: LAPAROSCOPIC  CHOLECYSTECTOMY WITH INTRAOPERATIVE CHOLANGIOGRAM;  Surgeon: Helyn Lobstein, MD;  Location: [facility];  Service: General;  Laterality: N/A;  LAP CHOLE WITH X-RAY   WISDOM TOOTH EXTRACTION     in oral surgeons office    Family History  Problem Relation Age of Onset   Cancer Mother        ovarian   Mitral valve prolapse Mother    Depression Mother    Bipolar disorder Mother    Schizophrenia Mother    Drug abuse Mother    Eating disorder Mother    Obesity Mother    Cancer Father    Cancer Maternal Grandmother        pancreatic, lymphoma      Controlled substance contract: n/a     Review of Systems  Constitutional:  Negative for diaphoresis.  Eyes:  Negative for pain.  Respiratory:  Negative for shortness of breath.   Cardiovascular:  Negative for chest pain, palpitations and leg swelling.  Gastrointestinal:  Negative for abdominal pain.  Endocrine: Negative for polydipsia.  Skin:  Negative for rash.  Neurological:  Negative for dizziness, weakness and headaches.  Hematological:  Does not bruise/bleed easily.  All other systems reviewed and are negative.      Objective:   Physical Exam Vitals and nursing note reviewed.  Constitutional:      General: She is not in acute distress.    Appearance: Normal appearance. She is well-developed.  Neck:     Vascular: No carotid bruit or JVD.  Cardiovascular:     Rate and Rhythm: Normal rate and regular rhythm.     Heart sounds: Normal heart sounds.  Pulmonary:     Effort: Pulmonary effort is normal. No respiratory distress.     Breath sounds: Normal breath sounds. No wheezing or rales.  Chest:     Chest wall: No tenderness.  Abdominal:     General: Bowel sounds are normal. There is no distension or abdominal bruit.     Palpations: Abdomen is soft. There is no hepatomegaly, splenomegaly, mass or pulsatile mass.     Tenderness: There is no abdominal tenderness.  Musculoskeletal:        General: Normal range of motion.      Cervical back: Normal range of motion and neck supple.  Lymphadenopathy:     Cervical: No cervical adenopathy.  Skin:    General: Skin is warm and dry.  Neurological:     Mental Status: She is alert and oriented to person, place, and time.     Deep Tendon Reflexes: Reflexes are normal and symmetric.  Psychiatric:        Behavior: Behavior normal.        Thought Content: Thought content normal.        Judgment: Judgment normal.    BP (!) 163/98   Pulse 76   Temp 97.7 F (36.5 C) (Temporal)   Ht 5\' 4"  (1.626 m)   Wt 237 lb (107.5 kg)   SpO2 98%   BMI 40.68 kg/m           Assessment & Plan:  Mckinzie Saksa comes in today with chief complaint of medical management of chronic issues    Diagnosis and orders addressed:  1. Gastroesophageal reflux disease without esophagitis Avoid spicy foods Do not eat 2 hours prior to bedtime   2. Depression, recurrent (HCC) Added wellbutrin  Stress management - buPROPion  (WELLBUTRIN  XL) 150 MG 24 hr tablet; Take 1 tablet (150 mg total) by mouth daily.  Dispense: 90 tablet; Refill: 1 - citalopram  (CELEXA ) 40 MG tablet; Take 1 tablet (40 mg total) by mouth daily.  Dispense: 90 tablet; Refill: 1  3. Primary insomnia Bedtime routine  4. Vitamin D  deficiency Continue vitamin d  supplement   Labs pending Health Maintenance reviewed Diet and exercise encouraged  Follow up plan: 3 months   Mary-Margaret Gaylyn Keas, FNP

## 2023-05-27 NOTE — Patient Instructions (Signed)

## 2023-05-28 LAB — CBC WITH DIFFERENTIAL/PLATELET
Basophils Absolute: 0.1 10*3/uL (ref 0.0–0.2)
Basos: 1 %
EOS (ABSOLUTE): 0.1 10*3/uL (ref 0.0–0.4)
Eos: 2 %
Hematocrit: 36.5 % (ref 34.0–46.6)
Hemoglobin: 12 g/dL (ref 11.1–15.9)
Immature Grans (Abs): 0 10*3/uL (ref 0.0–0.1)
Immature Granulocytes: 0 %
Lymphocytes Absolute: 1.5 10*3/uL (ref 0.7–3.1)
Lymphs: 23 %
MCH: 26.7 pg (ref 26.6–33.0)
MCHC: 32.9 g/dL (ref 31.5–35.7)
MCV: 81 fL (ref 79–97)
Monocytes Absolute: 0.5 10*3/uL (ref 0.1–0.9)
Monocytes: 7 %
Neutrophils Absolute: 4.5 10*3/uL (ref 1.4–7.0)
Neutrophils: 67 %
Platelets: 278 10*3/uL (ref 150–450)
RBC: 4.49 x10E6/uL (ref 3.77–5.28)
RDW: 12.2 % (ref 11.7–15.4)
WBC: 6.7 10*3/uL (ref 3.4–10.8)

## 2023-05-28 LAB — CMP14+EGFR
ALT: 7 IU/L (ref 0–32)
AST: 12 IU/L (ref 0–40)
Albumin: 3.8 g/dL — ABNORMAL LOW (ref 3.9–4.9)
Alkaline Phosphatase: 74 IU/L (ref 44–121)
BUN/Creatinine Ratio: 20 (ref 9–23)
BUN: 17 mg/dL (ref 6–24)
Bilirubin Total: <0.2 mg/dL (ref 0.0–1.2)
CO2: 23 mmol/L (ref 20–29)
Calcium: 9.4 mg/dL (ref 8.7–10.2)
Chloride: 104 mmol/L (ref 96–106)
Creatinine, Ser: 0.86 mg/dL (ref 0.57–1.00)
Globulin, Total: 2.7 g/dL (ref 1.5–4.5)
Glucose: 97 mg/dL (ref 70–99)
Potassium: 4.1 mmol/L (ref 3.5–5.2)
Sodium: 139 mmol/L (ref 134–144)
Total Protein: 6.5 g/dL (ref 6.0–8.5)
eGFR: 83 mL/min/{1.73_m2} (ref >59)

## 2023-05-28 LAB — LIPID PANEL
Chol/HDL Ratio: 3.9 ratio (ref 0.0–4.4)
Cholesterol, Total: 178 mg/dL (ref 100–199)
HDL: 46 mg/dL (ref >39)
LDL Chol Calc (NIH): 106 mg/dL — ABNORMAL HIGH (ref 0–99)
Triglycerides: 150 mg/dL — ABNORMAL HIGH (ref 0–149)
VLDL Cholesterol Cal: 26 mg/dL (ref 5–40)

## 2023-06-14 ENCOUNTER — Ambulatory Visit: Admitting: Medical

## 2023-06-29 ENCOUNTER — Other Ambulatory Visit: Payer: Self-pay | Admitting: Nurse Practitioner

## 2023-06-29 DIAGNOSIS — F339 Major depressive disorder, recurrent, unspecified: Secondary | ICD-10-CM

## 2023-07-04 ENCOUNTER — Other Ambulatory Visit: Payer: Self-pay | Admitting: Family Medicine

## 2023-07-04 DIAGNOSIS — K219 Gastro-esophageal reflux disease without esophagitis: Secondary | ICD-10-CM

## 2023-07-08 ENCOUNTER — Telehealth: Admitting: Family Medicine

## 2023-07-08 ENCOUNTER — Encounter: Payer: Self-pay | Admitting: Family Medicine

## 2023-07-08 DIAGNOSIS — J069 Acute upper respiratory infection, unspecified: Secondary | ICD-10-CM | POA: Diagnosis not present

## 2023-07-08 DIAGNOSIS — R0989 Other specified symptoms and signs involving the circulatory and respiratory systems: Secondary | ICD-10-CM | POA: Diagnosis not present

## 2023-07-08 MED ORDER — AZITHROMYCIN 250 MG PO TABS: ORAL_TABLET | ORAL | 0 refills | Status: DC

## 2023-07-08 MED ORDER — ALBUTEROL SULFATE HFA 108 (90 BASE) MCG/ACT IN AERS: INHALATION_SPRAY | RESPIRATORY_TRACT | 0 refills | Status: AC

## 2023-07-08 MED ORDER — CHLORPHEN-PE-ACETAMINOPHEN 4-10-325 MG PO TABS: 1.0000 | ORAL_TABLET | Freq: Four times a day (QID) | ORAL | 0 refills | Status: AC | PRN

## 2023-07-08 MED ORDER — GUAIFENESIN ER 600 MG PO TB12: 600.0000 mg | ORAL_TABLET | Freq: Two times a day (BID) | ORAL | 0 refills | Status: AC

## 2023-07-08 NOTE — Progress Notes (Signed)
 Virtual Visit via Video   I connected with patient on 07/08/23 at 1135 by a video enabled telemedicine application and verified that I am speaking with the correct person using two identifiers.  Location patient: Home Location provider: [facility] Persons participating in the virtual visit: Patient and Provider  I discussed the limitations of evaluation and management by telemedicine and the availability of in person appointments. The patient expressed understanding and agreed to proceed.  Subjective:   HPI:  Pt presents today for  Chief Complaint  Patient presents with   Cough    Judy Olson is a 48 year old female who presents with symptoms of upper respiratory congestion and voice changes.  She began experiencing symptoms a couple of days ago, initially feeling a 'tickle' in her throat that has since progressed to a sensation of drainage and a 'gunky' feeling. She frequently clears her throat and takes deep breaths. Her voice is currently better than usual, but she anticipates losing it soon.  She has a history of frequent bronchitis episodes, which improved after changing her allergy medications a few years ago. Currently, she has not taken any over-the-counter or prescription medications for her symptoms, except for ibuprofen . No significant cough, fever, or recent exposure to sick individuals.  She does not have an albuterol  inhaler at home as her son took it with him to [place] , although she believes she has a refill available. She uses [facility] and [facility] for her medications.       ROS per HPI  Patient Active Problem List   Diagnosis Date Noted   Primary hypertension 07/19/2022   Gastroesophageal reflux disease without esophagitis 12/30/2021   Vitamin D  deficiency 12/30/2021   Chronic pain of left knee 09/02/2020   BMI 36.0-36.9,adult 02/19/2019   Weight loss counseling, encounter for 02/19/2019   Alopecia 10/20/2017    Acne vulgaris 10/20/2017   Depression, recurrent (HCC) 11/24/2016   Ruptured lumbar disc 11/23/2016   Lumbar radiculopathy 11/23/2016   Slow transit constipation 01/16/2016   Insomnia 01/16/2016   Allergic rhinitis 10/28/2015   PVC's (premature ventricular contractions) 01/18/2014    Social History   Tobacco Use   Smoking status: Never   Smokeless tobacco: Never  Substance Use Topics   Alcohol use: No    Current Outpatient Medications:    azithromycin  (ZITHROMAX  Z-PAK) 250 MG tablet, As directed, Disp: 6 tablet, Rfl: 0   Chlorphen-PE-Acetaminophen  4-10-325 MG TABS, Take 1 tablet by mouth every 6 (six) hours as needed (cough. congestion, rhinorrhea)., Disp: 20 tablet, Rfl: 0   guaiFENesin  (MUCINEX ) 600 MG 12 hr tablet, Take 1 tablet (600 mg total) by mouth 2 (two) times daily for 10 days., Disp: 20 tablet, Rfl: 0   albuterol  (VENTOLIN  HFA) 108 (90 Base) MCG/ACT inhaler, INHALE 2 PUFFS BY MOUTH EVERY 6 HOURS AS NEEDED FOR WHEEZING OR  SHORTNESS  OF  BREATH., Disp: 18 g, Rfl: 0   BD PEN NEEDLE NANO 2ND GEN 32G X 4 MM MISC, USE TO INJECT SAXENDA  DAILY, Disp: 100 each, Rfl: 0   buPROPion  (WELLBUTRIN  XL) 150 MG 24 hr tablet, Take 1 tablet (150 mg total) by mouth daily., Disp: 90 tablet, Rfl: 1   citalopram  (CELEXA ) 40 MG tablet, Take 1 tablet (40 mg total) by mouth daily., Disp: 90 tablet, Rfl: 1   clindamycin  (CLEOCIN -T) 1 % external solution, Apply topically 2 (two) times daily., Disp: 60 mL, Rfl: 11   docusate sodium  (COLACE) 100 MG capsule, Take 1  capsule (100 mg total) by mouth 2 (two) times daily., Disp: 10 capsule, Rfl: 0   ELDERBERRY PO, Take by mouth., Disp: , Rfl:    famotidine  (PEPCID ) 20 MG tablet, Take 1 tablet by mouth twice daily, Disp: 180 tablet, Rfl: 1   finasteride  (PROSCAR ) 5 MG tablet, Take 0.5 tablets (2.5 mg total) by mouth daily. **NEEDS TO BE SEEN BEFORE NEXT REFILL**, Disp: 90 tablet, Rfl: 1   gabapentin  (NEURONTIN ) 100 MG capsule, Take 1 capsule (100 mg total) by  mouth at bedtime., Disp: 90 capsule, Rfl: 1   ibuprofen  (ADVIL ) 800 MG tablet, TAKE 1 TABLET BY MOUTH EVERY 8 HOURS AS NEEDED, Disp: 30 tablet, Rfl: 0   Levocetirizine Dihydrochloride (XYZAL ALLERGY 24HR PO), Take by mouth., Disp: , Rfl:    linaclotide  (LINZESS ) 72 MCG capsule, Take 1 capsule (72 mcg total) by mouth daily before breakfast., Disp: 90 capsule, Rfl: 1   lisinopril  (ZESTRIL ) 20 MG tablet, Take 1 tablet (20 mg total) by mouth daily., Disp: 90 tablet, Rfl: 1   medroxyPROGESTERone  (PROVERA ) 10 MG tablet, 2 tablets daily for 5 days, Disp: 10 tablet, Rfl: 0   norgestimate-ethinyl estradiol (ORTHO-CYCLEN) 0.25-35 MG-MCG tablet, Take 1 tablet by mouth daily., Disp: , Rfl:    tretinoin  (RETIN-A ) 0.025 % cream, Apply to the face in the evening, Disp: 45 g, Rfl: 11   Vitamin D , Ergocalciferol , (DRISDOL ) 1.25 MG (50000 UNIT) CAPS capsule, Take 1 capsule (50,000 Units total) by mouth every 7 (seven) days., Disp: 5 capsule, Rfl: 0  Allergies  Allergen Reactions   Hydrocodone  Shortness Of Breath   Banana Other (See Comments)    Chest tightness     Objective:   There were no vitals taken for this visit.  Patient is well-developed, well-nourished in no acute distress.  Resting comfortably at home.  Head is normocephalic, atraumatic.  No labored breathing.  Speech is clear and coherent with logical content.  Patient is alert and oriented at baseline.  Raspy voice  Assessment and Plan:   Judy Olson was seen today for cough.  Diagnoses and all orders for this visit:  Chest congestion -     albuterol  (VENTOLIN  HFA) 108 (90 Base) MCG/ACT inhaler; INHALE 2 PUFFS BY MOUTH EVERY 6 HOURS AS NEEDED FOR WHEEZING OR  SHORTNESS  OF  BREATH. -     guaiFENesin  (MUCINEX ) 600 MG 12 hr tablet; Take 1 tablet (600 mg total) by mouth 2 (two) times daily for 10 days. -     Chlorphen-PE-Acetaminophen  4-10-325 MG TABS; Take 1 tablet by mouth every 6 (six) hours as needed (cough. congestion, rhinorrhea). -      azithromycin  (ZITHROMAX  Z-PAK) 250 MG tablet; As directed  URI with cough and congestion -     albuterol  (VENTOLIN  HFA) 108 (90 Base) MCG/ACT inhaler; INHALE 2 PUFFS BY MOUTH EVERY 6 HOURS AS NEEDED FOR WHEEZING OR  SHORTNESS  OF  BREATH. -     guaiFENesin  (MUCINEX ) 600 MG 12 hr tablet; Take 1 tablet (600 mg total) by mouth 2 (two) times daily for 10 days. -     Chlorphen-PE-Acetaminophen  4-10-325 MG TABS; Take 1 tablet by mouth every 6 (six) hours as needed (cough. congestion, rhinorrhea). -     azithromycin  (ZITHROMAX  Z-PAK) 250 MG tablet; As directed      Upper Respiratory Tract Infection Presents with symptoms indicative of an upper respiratory tract infection, including postnasal drip, throat clearing, and mild cough. Symptoms began a couple of days ago, progressing from a tickle to  a sensation of congestion. No significant cough, fever, or known exposure to sick individuals. Frequent bronchitis episodes have improved with changes in allergy medications. Current symptoms are mild, not warranting immediate antibiotic use unless she worsens or does not improve with initial treatment. Informed consent provided regarding the use of guaifenesin , albuterol , and antitussive for symptomatic relief. Antibiotics reserved for worsening symptoms or lack of improvement within a couple of days. - Prescribe guaifenesin  for symptomatic relief. - Prescribe albuterol  inhaler for respiratory symptoms. - Prescribe antitussive for symptomatic relief. - Provide a prescription for antibiotics to be used if symptoms worsen or do not improve within a couple of days.        Return if symptoms worsen or fail to improve.  Kattie Parrot, FNP-C [facility] [address] [place], [place] [address] [phone]  07/08/2023  Time spent with the patient: 15 minutes, of which >50% was spent in obtaining information about symptoms, reviewing previous labs, evaluations, and treatments,  counseling about condition (please see the discussed topics above), and developing a plan to further investigate it; had a number of questions which I addressed.

## 2023-11-28 ENCOUNTER — Encounter: Payer: Self-pay | Admitting: Nurse Practitioner

## 2023-11-28 ENCOUNTER — Ambulatory Visit (INDEPENDENT_AMBULATORY_CARE_PROVIDER_SITE_OTHER): Admitting: Nurse Practitioner

## 2023-11-28 VITALS — BP 158/105 | HR 68 | Temp 97.2°F | Ht 64.0 in | Wt 220.0 lb

## 2023-11-28 DIAGNOSIS — E559 Vitamin D deficiency, unspecified: Secondary | ICD-10-CM

## 2023-11-28 DIAGNOSIS — I10 Essential (primary) hypertension: Secondary | ICD-10-CM

## 2023-11-28 DIAGNOSIS — K219 Gastro-esophageal reflux disease without esophagitis: Secondary | ICD-10-CM

## 2023-11-28 DIAGNOSIS — F5101 Primary insomnia: Secondary | ICD-10-CM | POA: Diagnosis not present

## 2023-11-28 DIAGNOSIS — G8929 Other chronic pain: Secondary | ICD-10-CM

## 2023-11-28 DIAGNOSIS — M25562 Pain in left knee: Secondary | ICD-10-CM

## 2023-11-28 DIAGNOSIS — F339 Major depressive disorder, recurrent, unspecified: Secondary | ICD-10-CM | POA: Diagnosis not present

## 2023-11-28 LAB — LIPID PANEL

## 2023-11-28 MED ORDER — GABAPENTIN 100 MG PO CAPS: 100.0000 mg | ORAL_CAPSULE | Freq: Every day | ORAL | 1 refills | Status: AC

## 2023-11-28 MED ORDER — FAMOTIDINE 20 MG PO TABS: 20.0000 mg | ORAL_TABLET | Freq: Two times a day (BID) | ORAL | 1 refills | Status: AC

## 2023-11-28 MED ORDER — BUPROPION HCL ER (XL) 150 MG PO TB24: 150.0000 mg | ORAL_TABLET | Freq: Every day | ORAL | 1 refills | Status: AC

## 2023-11-28 MED ORDER — LISINOPRIL 40 MG PO TABS: 40.0000 mg | ORAL_TABLET | Freq: Every day | ORAL | 3 refills | Status: AC

## 2023-11-28 MED ORDER — CITALOPRAM HYDROBROMIDE 40 MG PO TABS: 40.0000 mg | ORAL_TABLET | Freq: Every day | ORAL | 1 refills | Status: AC

## 2023-11-28 NOTE — Progress Notes (Signed)
 Subjective:    Patient ID: Judy Olson, female    DOB: March 04, 1975, 48 y.o.   MRN: [id number]   Chief Complaint: medical management of chronic issues     HPI:  Hagan Maltz is a 48 y.o. who identifies as a female who was assigned female at birth.   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 Primary hypertension No c/o chest pain, sob or headache. Does not check blood pressure at home very often. Usually 120/80 a when she does check it. BP Readings from Last 3 Encounters:  11/28/23 (!) 158/105  05/27/23 (!) 163/98  08/10/22 127/82     2. Gastroesophageal reflux disease without esophagitis Takes pepcid  on an as needed basis  3. Depression, recurrent (HCC) Is on celexa  and is doing well. Depression is mainly due to her weight     05/27/2023    4:20 PM 08/10/2022    3:14 PM 07/19/2022    3:38 PM  Depression screen PHQ 2/9  Decreased Interest 2 0 3  Down, Depressed, Hopeless 0 0 0  PHQ - 2 Score 2 0 3  Altered sleeping 2 0 2  Tired, decreased energy 3 2 3   Change in appetite 3 1 2   Feeling bad or failure about yourself  0 0 0  Trouble concentrating 2 0 2  Moving slowly or fidgety/restless 0 0 0  Suicidal thoughts 0 0 0  PHQ-9 Score 12 3 12   Difficult doing work/chores Not difficult at all Not difficult at all       4. Primary insomnia Sleeps about 5-6 hours a night. She takes tylenol  PM which does help when she takes it.  5. Vitamin D  deficiency Is on daily vitamin d  supplement  6. Morbid obesity Weight is down 17lbs  Wt Readings from Last 3 Encounters:  11/28/23 220 lb (99.8 kg)  05/27/23 237 lb (107.5 kg)  08/10/22 230 lb (104.3 kg)   BMI Readings from Last 3 Encounters:  11/28/23 37.76 kg/m  05/27/23 40.68 kg/m  08/10/22 39.48 kg/m         New complaints: Has history of hgba1c of 5.7 in 2023. She does not check her blood sugars  at home.  Allergies  Allergen Reactions   Hydrocodone  Shortness Of Breath   Banana Other (See Comments)    Chest tightness    Outpatient Encounter Medications as of 11/28/2023  Medication Sig   albuterol  (VENTOLIN  HFA) 108 (90 Base) MCG/ACT inhaler INHALE 2 PUFFS BY MOUTH EVERY 6 HOURS AS NEEDED FOR WHEEZING OR  SHORTNESS  OF  BREATH.   azithromycin  (ZITHROMAX  Z-PAK) 250 MG tablet As directed   BD PEN NEEDLE NANO 2ND GEN 32G X 4 MM MISC USE TO INJECT SAXENDA  DAILY   buPROPion  (WELLBUTRIN  XL) 150 MG 24 hr tablet Take 1 tablet (150 mg total) by mouth daily.   Chlorphen-PE-Acetaminophen  4-10-325 MG TABS Take 1 tablet by mouth every 6 (six) hours as needed (cough. congestion, rhinorrhea).   citalopram  (CELEXA ) 40 MG tablet Take 1 tablet (40 mg total) by mouth daily.   clindamycin  (CLEOCIN -T) 1 % external solution Apply topically 2 (two) times daily.   docusate sodium  (COLACE) 100 MG capsule Take 1 capsule (100 mg total) by mouth 2 (two) times daily.   ELDERBERRY PO Take by mouth.   famotidine  (PEPCID ) 20 MG tablet Take 1 tablet by mouth twice daily   finasteride  (PROSCAR )  5 MG tablet Take 0.5 tablets (2.5 mg total) by mouth daily. **NEEDS TO BE SEEN BEFORE NEXT REFILL**   gabapentin  (NEURONTIN ) 100 MG capsule Take 1 capsule (100 mg total) by mouth at bedtime.   ibuprofen  (ADVIL ) 800 MG tablet TAKE 1 TABLET BY MOUTH EVERY 8 HOURS AS NEEDED   Levocetirizine Dihydrochloride (XYZAL ALLERGY 24HR PO) Take by mouth.   linaclotide  (LINZESS ) 72 MCG capsule Take 1 capsule (72 mcg total) by mouth daily before breakfast.   lisinopril  (ZESTRIL ) 20 MG tablet Take 1 tablet (20 mg total) by mouth daily.   medroxyPROGESTERone  (PROVERA ) 10 MG tablet 2 tablets daily for 5 days   norgestimate-ethinyl estradiol (ORTHO-CYCLEN) 0.25-35 MG-MCG tablet Take 1 tablet by mouth daily.   tretinoin  (RETIN-A ) 0.025 % cream Apply to the face in the evening   Vitamin D , Ergocalciferol , (DRISDOL ) 1.25 MG (50000 UNIT) CAPS  capsule Take 1 capsule (50,000 Units total) by mouth every 7 (seven) days.   No facility-administered encounter medications on file as of 11/28/2023.    Past Surgical History:  Procedure Laterality Date   CHOLECYSTECTOMY  12/23/2010   Procedure: LAPAROSCOPIC CHOLECYSTECTOMY WITH INTRAOPERATIVE CHOLANGIOGRAM;  Surgeon: Elsie JINNY Signs, MD;  Location: [facility];  Service: General;  Laterality: N/A;  LAP CHOLE WITH X-RAY   WISDOM TOOTH EXTRACTION     in oral surgeons office    Family History  Problem Relation Age of Onset   Cancer Mother        ovarian   Mitral valve prolapse Mother    Depression Mother    Bipolar disorder Mother    Schizophrenia Mother    Drug abuse Mother    Eating disorder Mother    Obesity Mother    Cancer Father    Cancer Maternal Grandmother        pancreatic, lymphoma      Controlled substance contract: n/a     Review of Systems  Constitutional:  Negative for diaphoresis.  Eyes:  Negative for pain.  Respiratory:  Negative for shortness of breath.   Cardiovascular:  Negative for chest pain, palpitations and leg swelling.  Gastrointestinal:  Negative for abdominal pain.  Endocrine: Negative for polydipsia.  Skin:  Negative for rash.  Neurological:  Negative for dizziness, weakness and headaches.  Hematological:  Does not bruise/bleed easily.  All other systems reviewed and are negative.      Objective:   Physical Exam Vitals and nursing note reviewed.  Constitutional:      General: She is not in acute distress.    Appearance: Normal appearance. She is well-developed.  Neck:     Vascular: No carotid bruit or JVD.  Cardiovascular:     Rate and Rhythm: Normal rate and regular rhythm.     Heart sounds: Normal heart sounds.  Pulmonary:     Effort: Pulmonary effort is normal. No respiratory distress.     Breath sounds: Normal breath sounds. No wheezing or rales.  Chest:     Chest wall: No tenderness.  Abdominal:     General: Bowel  sounds are normal. There is no distension or abdominal bruit.     Palpations: Abdomen is soft. There is no hepatomegaly, splenomegaly, mass or pulsatile mass.     Tenderness: There is no abdominal tenderness.  Musculoskeletal:        General: Normal range of motion.     Cervical back: Normal range of motion and neck supple.  Lymphadenopathy:     Cervical: No cervical adenopathy.  Skin:  General: Skin is warm and dry.  Neurological:     Mental Status: She is alert and oriented to person, place, and time.     Deep Tendon Reflexes: Reflexes are normal and symmetric.  Psychiatric:        Behavior: Behavior normal.        Thought Content: Thought content normal.        Judgment: Judgment normal.    BP (!) 158/105   Pulse 68   Temp (!) 97.2 F (36.2 C) (Temporal)   Ht 5' 4 (1.626 m)   Wt 220 lb (99.8 kg)   SpO2 100%   BMI 37.76 kg/m            Assessment & Plan:  Lindie Roberson comes in today with chief complaint of medical management of chronic issues    Diagnosis and orders addressed:  1. Gastroesophageal reflux disease without esophagitis Avoid spicy foods Do not eat 2 hours prior to bedtime   2. Depression, recurrent (HCC) Added wellbutrin  Stress management - buPROPion  (WELLBUTRIN  XL) 150 MG 24 hr tablet; Take 1 tablet (150 mg total) by mouth daily.  Dispense: 90 tablet; Refill: 1 - citalopram  (CELEXA ) 40 MG tablet; Take 1 tablet (40 mg total) by mouth daily.  Dispense: 90 tablet; Refill: 1  3. Primary insomnia Bedtime routine  4. Vitamin D  deficiency Continue vitamin d  supplement  5. Morbid obesity Discussed diet and exercise for person with BMI >25 Will recheck weight in 3-6 months   Labs pending Health Maintenance reviewed Diet and exercise encouraged  Follow up plan: 3 months   Mary-Margaret Gladis, FNP

## 2023-11-28 NOTE — Patient Instructions (Signed)

## 2023-11-29 ENCOUNTER — Ambulatory Visit: Payer: Self-pay | Admitting: Nurse Practitioner

## 2023-11-29 LAB — CMP14+EGFR
ALT: 10 IU/L (ref 0–32)
AST: 23 IU/L (ref 0–40)
Albumin: 4.5 g/dL (ref 3.9–4.9)
Alkaline Phosphatase: 82 IU/L (ref 41–116)
BUN/Creatinine Ratio: 23 (ref 9–23)
BUN: 20 mg/dL (ref 6–24)
Bilirubin Total: 0.3 mg/dL (ref 0.0–1.2)
CO2: 25 mmol/L (ref 20–29)
Calcium: 9.6 mg/dL (ref 8.7–10.2)
Chloride: 99 mmol/L (ref 96–106)
Creatinine, Ser: 0.87 mg/dL (ref 0.57–1.00)
Globulin, Total: 2.3 g/dL (ref 1.5–4.5)
Glucose: 80 mg/dL (ref 70–99)
Potassium: 3.9 mmol/L (ref 3.5–5.2)
Sodium: 138 mmol/L (ref 134–144)
Total Protein: 6.8 g/dL (ref 6.0–8.5)
eGFR: 82 mL/min/1.73 (ref >59)

## 2023-11-29 LAB — CBC WITH DIFFERENTIAL/PLATELET
Basophils Absolute: 0.1 x10E3/uL (ref 0.0–0.2)
Basos: 1 %
EOS (ABSOLUTE): 0.2 x10E3/uL (ref 0.0–0.4)
Eos: 3 %
Hematocrit: 40 % (ref 34.0–46.6)
Hemoglobin: 12.8 g/dL (ref 11.1–15.9)
Immature Grans (Abs): 0 x10E3/uL (ref 0.0–0.1)
Immature Granulocytes: 0 %
Lymphocytes Absolute: 1.9 x10E3/uL (ref 0.7–3.1)
Lymphs: 27 %
MCH: 26.8 pg (ref 26.6–33.0)
MCHC: 32 g/dL (ref 31.5–35.7)
MCV: 84 fL (ref 79–97)
Monocytes Absolute: 0.6 x10E3/uL (ref 0.1–0.9)
Monocytes: 8 %
Neutrophils Absolute: 4.4 x10E3/uL (ref 1.4–7.0)
Neutrophils: 61 %
Platelets: 246 x10E3/uL (ref 150–450)
RBC: 4.78 x10E6/uL (ref 3.77–5.28)
RDW: 13.6 % (ref 11.7–15.4)
WBC: 7.2 x10E3/uL (ref 3.4–10.8)

## 2023-11-29 LAB — LIPID PANEL
Cholesterol, Total: 174 mg/dL (ref 100–199)
HDL: 48 mg/dL (ref >39)
LDL CALC COMMENT:: 3.6 ratio (ref 0.0–4.4)
LDL Chol Calc (NIH): 107 mg/dL — AB (ref 0–99)
Triglycerides: 105 mg/dL (ref 0–149)
VLDL Cholesterol Cal: 19 mg/dL (ref 5–40)

## 2023-11-29 LAB — BAYER DCA HB A1C WAIVED: HB A1C (BAYER DCA - WAIVED): 5.5 % (ref 4.8–5.6)

## 2024-05-28 ENCOUNTER — Ambulatory Visit: Payer: Self-pay | Admitting: Nurse Practitioner
